# Patient Record
Sex: Male | Born: 1995 | Race: Black or African American | Hispanic: No | Marital: Single | Smoking: Light tobacco smoker
Health system: Southern US, Community
[De-identification: ages and names within clinical notes are randomized; demographics above are authoritative.]

## PROBLEM LIST (undated history)

## (undated) DIAGNOSIS — F32A Depression, unspecified: Secondary | ICD-10-CM

## (undated) DIAGNOSIS — B977 Papillomavirus as the cause of diseases classified elsewhere: Secondary | ICD-10-CM

## (undated) DIAGNOSIS — F800 Phonological disorder: Secondary | ICD-10-CM

## (undated) DIAGNOSIS — F341 Dysthymic disorder: Secondary | ICD-10-CM

## (undated) DIAGNOSIS — F329 Major depressive disorder, single episode, unspecified: Secondary | ICD-10-CM

---
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
## 2018-09-01 ENCOUNTER — Emergency Department (HOSPITAL_COMMUNITY)
Admission: EM | Admit: 2018-09-01 | Discharge: 2018-09-01 | Disposition: A | Discharge status: Home / Self Care | Payer: Medicaid Other | Attending: Emergency Medicine | Admitting: Emergency Medicine

## 2018-09-01 ENCOUNTER — Other Ambulatory Visit: Payer: Self-pay

## 2018-09-01 ENCOUNTER — Encounter (HOSPITAL_COMMUNITY): Payer: Self-pay | Admitting: Emergency Medicine

## 2018-09-01 DIAGNOSIS — Z79899 Other long term (current) drug therapy: Secondary | ICD-10-CM | POA: Insufficient documentation

## 2018-09-01 DIAGNOSIS — F17210 Nicotine dependence, cigarettes, uncomplicated: Secondary | ICD-10-CM | POA: Diagnosis not present

## 2018-09-01 DIAGNOSIS — K0889 Other specified disorders of teeth and supporting structures: Secondary | ICD-10-CM

## 2018-09-01 MED ORDER — IBUPROFEN 600 MG PO TABS: 600.0000 mg | ORAL_TABLET | Freq: Four times a day (QID) | ORAL | 0 refills | Status: DC | PRN

## 2018-09-01 NOTE — ED Triage Notes (Signed)
Right side of mouth pain, feels it is his wisdom teeth coming in. Not able to eat today d/t pain.

## 2018-09-01 NOTE — Discharge Instructions (Addendum)
Please read and follow all provided instructions.  Your diagnoses today include:  1. Pain, dental     The exam and treatment you received today has been provided on an emergency basis only. This is not a substitute for complete medical or dental care. This problem will not resolve on its own without the care of a dentist.  Tests performed today include: Vital signs. See below for your results today.   Medications prescribed:   Take any prescribed medications only as directed. Use ibuprofen or naproxen for pain. Use the viscous lidocaine for mouth pain. Swish with the lidocaine and spit it out. Do not swallow it.  Follow-up instructions: Please follow-up with your dentist for further evaluation of your symptoms. Use dental resources. Call on Monday to schedule an appointment (09/03/18)  Dental Assistance: See handout for dental referrals  Return instructions:  Please return to the Emergency Department if you experience worsening symptoms. Please return if you develop a fever, you develop more swelling in your face or neck, you have trouble breathing or swallowing food. Please return if you have any other emergent concerns.  Additional Information:  Your vital signs today were: BP 126/84 (BP Location: Right Arm)    Pulse 79    Temp 98.4 F (36.9 C) (Oral)    Resp 17    SpO2 100%  If your blood pressure (BP) was elevated above 135/85 this visit, please have this repeated by your doctor within one month. --------------

## 2018-09-01 NOTE — ED Provider Notes (Signed)
[facility] EMERGENCY DEPARTMENT Provider Note   CSN: [id number] Arrival date & time: 09/01/18  2706     History   Chief Complaint Chief Complaint  Patient presents with  . Dental Pain    HPI David Mays is a 22 y.o. male medical history as below who presents emergency department today for dental pain.  Patient reports that his right lower wisdom tooth has been coming in.  He reports is been painful.  He has not been taking anything for this.  He is requesting referral to a dentist as he does not have one. Denies fever, chills, voice change, inability to control secretions, nausea/vomiting, facial swelling, dysphagia, odynophagia, drainage or trauma  HPI  Past Medical History:  Diagnosis Date  . Depression   . Dysthymic disorder   . HPV (human papilloma virus) infection   . Phonological disorder     There are no active problems to display for this patient.   History reviewed. No pertinent surgical history.      Home Medications    Prior to Admission medications   Medication Sig Start Date End Date Taking? Authorizing Provider  escitalopram (LEXAPRO) 20 MG tablet Take 20 mg by mouth daily.    [provider]  ibuprofen (ADVIL,MOTRIN) 600 MG tablet Take 1 tablet (600 mg total) by mouth every 6 (six) hours as needed. 09/01/18   Rosaland Shiffman, Elmer Sow, PA-C    Family History No family history on file.  Social History Social History   Tobacco Use  . Smoking status: Light Tobacco Smoker    Types: Cigarettes  . Smokeless tobacco: Never Used  Substance Use Topics  . Alcohol use: Not Currently  . Drug use: Never     Allergies   Patient has no known allergies.   Review of Systems Review of Systems  All other systems reviewed and are negative.    Physical Exam Updated Vital Signs BP 126/84 (BP Location: Right Arm)   Pulse 79   Temp 98.4 F (36.9 C) (Oral)   Resp 17   SpO2 100%   Physical Exam  Constitutional: He appears  well-developed and well-nourished. No distress.  HENT:  Head: Normocephalic and atraumatic.  Right Ear: Hearing, tympanic membrane, external ear and ear canal normal. No foreign bodies. Tympanic membrane is not injected, not perforated, not erythematous, not retracted and not bulging.  Left Ear: Hearing, tympanic membrane, external ear and ear canal normal. No foreign bodies. Tympanic membrane is not injected, not perforated, not erythematous, not retracted and not bulging.  Nose: Nose normal. No mucosal edema, rhinorrhea, sinus tenderness, septal deviation or nasal septal hematoma.  No foreign bodies. Right sinus exhibits no maxillary sinus tenderness and no frontal sinus tenderness. Left sinus exhibits no maxillary sinus tenderness and no frontal sinus tenderness.  The patient has normal phonation and is in control of secretions. No stridor.  Midline uvula without edema. Soft palate rises symmetrically.  No tonsillar erythema or exudates. No PTA. Tongue protrusion is normal. No trismus. No creptius on neck palpation and patient has good dentition. Overall normal dentition. Tooth #32 emerging through gumline. No floor edema. No gingival erythema or fluctuance noted. Mucus membranes moist.  Eyes: Pupils are equal, round, and reactive to light. Conjunctivae, EOM and lids are normal. Right eye exhibits no discharge. Left eye exhibits no discharge. Right conjunctiva is not injected. Left conjunctiva is not injected.  Neck: Trachea normal, normal range of motion, full passive range of motion without pain and phonation normal.  Neck supple. No spinous process tenderness and no muscular tenderness present. No neck rigidity. No tracheal deviation and normal range of motion present.  No nuchal rigidity or meningismus. No crepitus on neck palpation. No submandibular or submental edema.  Cardiovascular: Normal rate and regular rhythm.  Pulmonary/Chest: Effort normal and breath sounds normal. No stridor. He has no  wheezes.  Abdominal: Soft.  Lymphadenopathy:       Head (right side): No submental, no submandibular, no tonsillar, no preauricular, no posterior auricular and no occipital adenopathy present.       Head (left side): No submental, no submandibular, no tonsillar, no preauricular, no posterior auricular and no occipital adenopathy present.    He has no cervical adenopathy.  Neurological: He is alert.  Skin: Skin is warm and dry. No rash noted.  Psychiatric: He has a normal mood and affect.  Nursing note and vitals reviewed.   ED Treatments / Results  Labs (all labs ordered are listed, but only abnormal results are displayed) Labs Reviewed - No data to display  EKG None  Radiology No results found.  Procedures Procedures (including critical care time)  Medications Ordered in ED Medications - No data to display   Initial Impression / Assessment and Plan / ED Course  I have reviewed the triage vital signs and the nursing notes.  Pertinent labs & imaging results that were available during my care of the patient were reviewed by me and considered in my medical decision making (see chart for details).     22 y.o. male with wisdom teeth coming through gumline. No gross abscess.  Exam unconcerning for Ludwig's angina or spread of infection. No indication for abx. Ibuprofen for pain. Dental resources given. Return precautions discussed.  Final Clinical Impressions(s) / ED Diagnoses   Final diagnoses:  Pain, dental    ED Discharge Orders         Ordered    ibuprofen (ADVIL,MOTRIN) 600 MG tablet  Every 6 hours PRN     09/01/18 2151           Princella Pellegrini 09/01/18 2235    Terrilee Files, MD 09/02/18 1208

## 2020-06-05 ENCOUNTER — Ambulatory Visit: Payer: Medicaid Other | Attending: Internal Medicine

## 2020-06-05 DIAGNOSIS — Z23 Encounter for immunization: Secondary | ICD-10-CM

## 2020-06-05 NOTE — Progress Notes (Signed)
   Covid-19 Vaccination Clinic  Name:  David Mays    MRN: [id number] DOB: 1995-12-26  06/05/2020  [name] was observed post Covid-19 immunization for 15 minutes without incident. He was provided with Vaccine Information Sheet and instruction to access the V-Safe system.   [name] was instructed to call 911 with any severe reactions post vaccine: Marland Kitchen Difficulty breathing  . Swelling of face and throat  . A fast heartbeat  . A bad rash all over body  . Dizziness and weakness   Immunizations Administered    Name Date Dose VIS Date Route   Moderna COVID-19 Vaccine 06/05/2020  1:08 PM 0.5 mL 10/2019 Intramuscular   Manufacturer: Moderna   Lot: [id number]   NDC: 20601-561-53

## 2020-06-09 ENCOUNTER — Emergency Department (HOSPITAL_COMMUNITY): Payer: Medicaid Other

## 2020-06-09 ENCOUNTER — Other Ambulatory Visit: Payer: Self-pay

## 2020-06-09 ENCOUNTER — Emergency Department (HOSPITAL_COMMUNITY)
Admission: EM | Admit: 2020-06-09 | Discharge: 2020-06-09 | Disposition: A | Discharge status: Home / Self Care | Payer: Medicaid Other | Attending: Emergency Medicine | Admitting: Emergency Medicine

## 2020-06-09 ENCOUNTER — Encounter (HOSPITAL_COMMUNITY): Payer: Self-pay | Admitting: Emergency Medicine

## 2020-06-09 DIAGNOSIS — R0789 Other chest pain: Secondary | ICD-10-CM | POA: Diagnosis not present

## 2020-06-09 DIAGNOSIS — F17210 Nicotine dependence, cigarettes, uncomplicated: Secondary | ICD-10-CM | POA: Diagnosis not present

## 2020-06-09 DIAGNOSIS — M25511 Pain in right shoulder: Secondary | ICD-10-CM | POA: Diagnosis present
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
## 2020-06-09 IMAGING — DX DG SHOULDER 2+V*R*
3 series · 3 of 3 positions shown · non-contrast
Comparison: None.

CLINICAL DATA: Right shoulder pain

EXAM:
RIGHT SHOULDER - 2+ VIEW

[shoulder grashey]
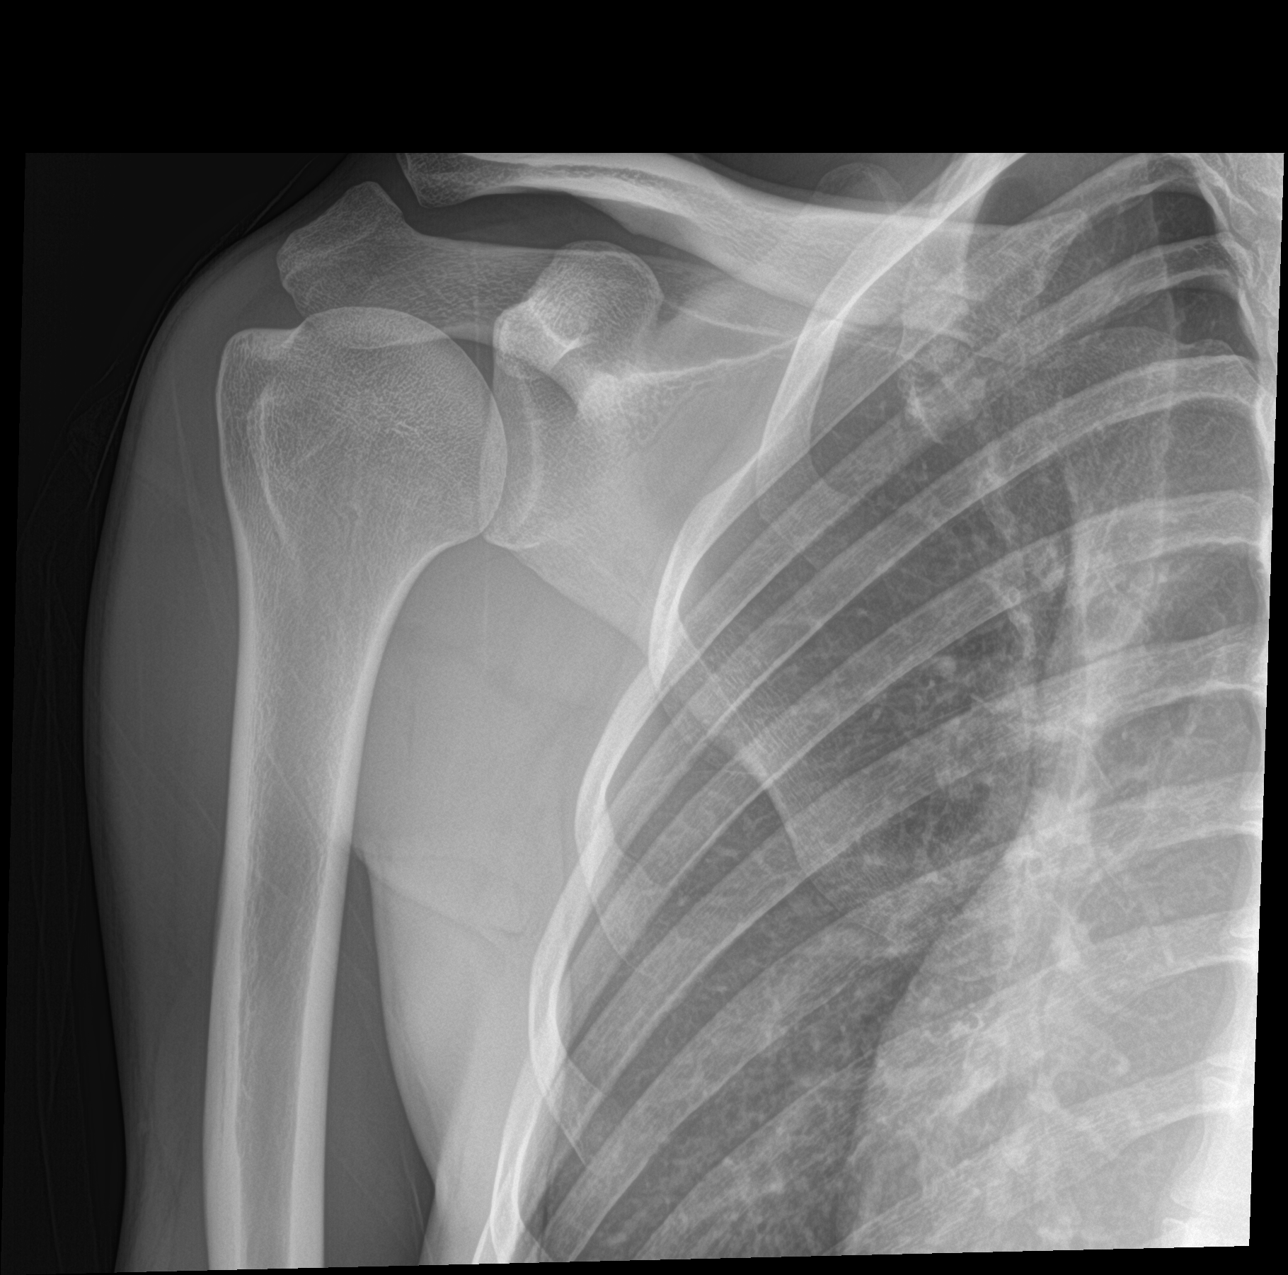

[shoulder y view]
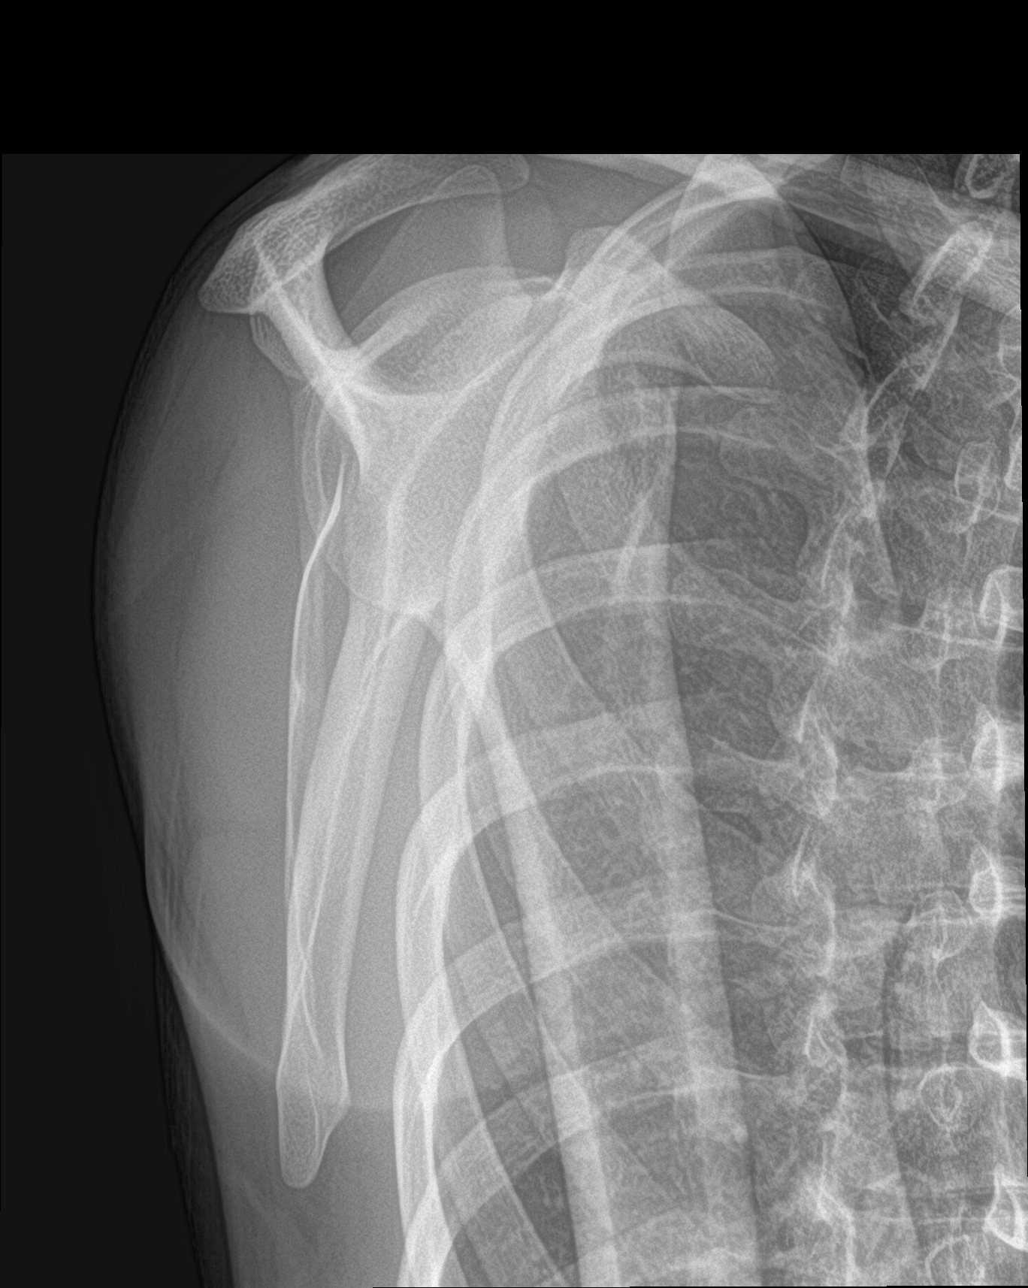

[shoulder ap neutral]
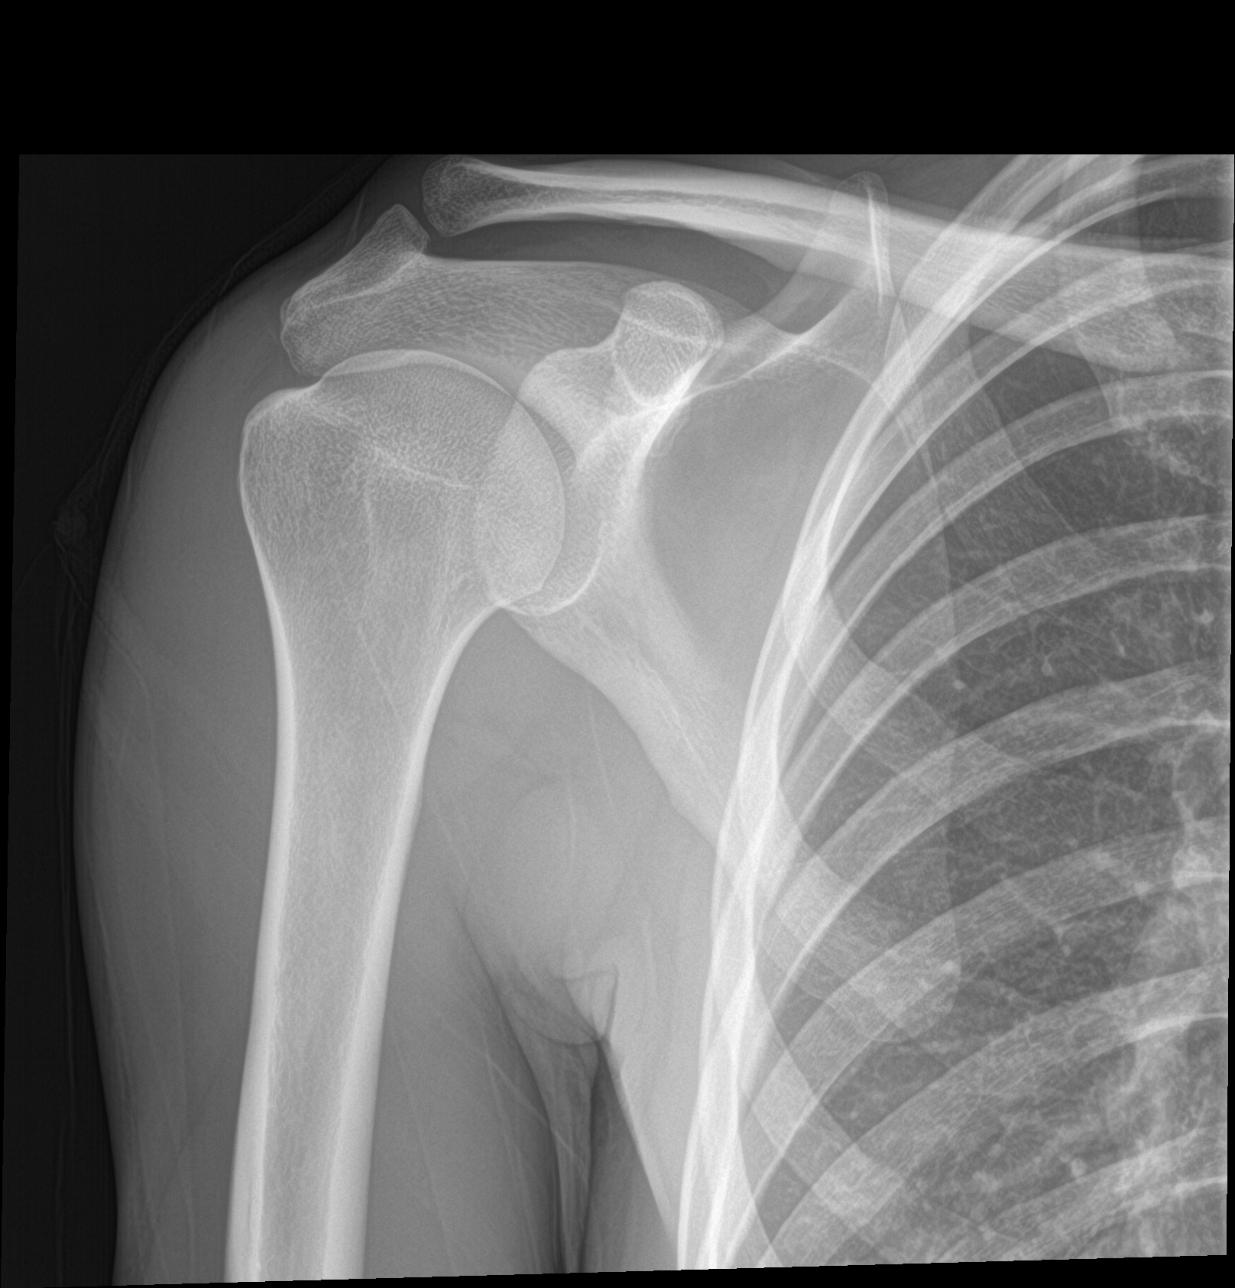

[3 of 3 positions shown; findings below may reference images not displayed]

FINDINGS: There is no evidence of fracture or dislocation. There is no
evidence of arthropathy or other focal bone abnormality. Soft
tissues are unremarkable.
IMPRESSION: Negative.
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
## 2020-06-09 MED ORDER — LIDOCAINE 4 % EX CREA: 1.0000 "application " | TOPICAL_CREAM | Freq: Three times a day (TID) | CUTANEOUS | 0 refills | Status: DC | PRN

## 2020-06-09 MED ORDER — ACETAMINOPHEN 500 MG PO TABS: 500.0000 mg | ORAL_TABLET | Freq: Four times a day (QID) | ORAL | 0 refills | Status: DC | PRN

## 2020-06-09 MED ORDER — IBUPROFEN 400 MG PO TABS
600.0000 mg | ORAL_TABLET | Freq: Once | ORAL | Status: AC
  Administered 2020-06-09: 600 mg via ORAL
  Filled 2020-06-09: qty 1

## 2020-06-09 MED ORDER — LIDOCAINE 5 % EX PTCH
2.0000 | MEDICATED_PATCH | CUTANEOUS | Status: DC
  Administered 2020-06-09: 2 via TRANSDERMAL
  Filled 2020-06-09: qty 2
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
## 2020-06-09 NOTE — Discharge Instructions (Signed)
Your x-rays today were reassuring. You can take extra strength Tylenol every 6 hours as needed for pain. You can also apply lidocaine cream to areas of pain. Apply ice or heat, whichever feels best 20 minutes a time a few times daily to areas of pain. Do some gentle stretching to avoid muscle stiffness. Follow-up with David Mays at the [facility] for medications and primary care follow-up. Return to the emergency department if any concerning signs or symptoms develop such as persistent vomiting, severe headaches, or fevers.

## 2020-07-07 ENCOUNTER — Ambulatory Visit: Payer: Medicaid Other

## 2021-02-02 ENCOUNTER — Inpatient Hospital Stay (HOSPITAL_COMMUNITY)
Admission: EM | Admit: 2021-02-02 | Discharge: 2021-02-09 | DRG: 472 | Disposition: A | Discharge status: Home / Self Care | Payer: Medicaid Other | Attending: General Surgery | Admitting: General Surgery

## 2021-02-02 ENCOUNTER — Emergency Department (HOSPITAL_COMMUNITY): Payer: Medicaid Other

## 2021-02-02 ENCOUNTER — Inpatient Hospital Stay (HOSPITAL_COMMUNITY): Payer: Medicaid Other

## 2021-02-02 DIAGNOSIS — T1490XA Injury, unspecified, initial encounter: Secondary | ICD-10-CM

## 2021-02-02 DIAGNOSIS — Z419 Encounter for procedure for purposes other than remedying health state, unspecified: Secondary | ICD-10-CM

## 2021-02-02 DIAGNOSIS — S52512A Displaced fracture of left radial styloid process, initial encounter for closed fracture: Secondary | ICD-10-CM | POA: Diagnosis present

## 2021-02-02 DIAGNOSIS — S0003XA Contusion of scalp, initial encounter: Secondary | ICD-10-CM | POA: Diagnosis present

## 2021-02-02 DIAGNOSIS — G5602 Carpal tunnel syndrome, left upper limb: Secondary | ICD-10-CM | POA: Diagnosis present

## 2021-02-02 DIAGNOSIS — S0081XA Abrasion of other part of head, initial encounter: Secondary | ICD-10-CM | POA: Diagnosis present

## 2021-02-02 DIAGNOSIS — S12600A Unspecified displaced fracture of seventh cervical vertebra, initial encounter for closed fracture: Secondary | ICD-10-CM | POA: Diagnosis present

## 2021-02-02 DIAGNOSIS — S12500A Unspecified displaced fracture of sixth cervical vertebra, initial encounter for closed fracture: Secondary | ICD-10-CM | POA: Diagnosis present

## 2021-02-02 DIAGNOSIS — H5462 Unqualified visual loss, left eye, normal vision right eye: Secondary | ICD-10-CM | POA: Diagnosis present

## 2021-02-02 DIAGNOSIS — Z23 Encounter for immunization: Secondary | ICD-10-CM

## 2021-02-02 DIAGNOSIS — D62 Acute posthemorrhagic anemia: Secondary | ICD-10-CM | POA: Diagnosis not present

## 2021-02-02 DIAGNOSIS — S12400A Unspecified displaced fracture of fifth cervical vertebra, initial encounter for closed fracture: Principal | ICD-10-CM | POA: Diagnosis present

## 2021-02-02 DIAGNOSIS — S30810A Abrasion of lower back and pelvis, initial encounter: Secondary | ICD-10-CM | POA: Diagnosis present

## 2021-02-02 DIAGNOSIS — M4312 Spondylolisthesis, cervical region: Secondary | ICD-10-CM | POA: Diagnosis present

## 2021-02-02 DIAGNOSIS — S92252A Displaced fracture of navicular [scaphoid] of left foot, initial encounter for closed fracture: Secondary | ICD-10-CM | POA: Diagnosis present

## 2021-02-02 DIAGNOSIS — Z5900 Homelessness unspecified: Secondary | ICD-10-CM

## 2021-02-02 DIAGNOSIS — Z20822 Contact with and (suspected) exposure to covid-19: Secondary | ICD-10-CM | POA: Diagnosis present

## 2021-02-02 DIAGNOSIS — S6412XA Injury of median nerve at wrist and hand level of left arm, initial encounter: Secondary | ICD-10-CM | POA: Diagnosis present

## 2021-02-02 LAB — URINALYSIS, ROUTINE W REFLEX MICROSCOPIC
Bacteria, UA: NONE SEEN
Bilirubin Urine: NEGATIVE
Glucose, UA: NEGATIVE mg/dL
Ketones, ur: NEGATIVE mg/dL
Leukocytes,Ua: NEGATIVE
Nitrite: NEGATIVE
Protein, ur: NEGATIVE mg/dL
Specific Gravity, Urine: 1.019 (ref 1.005–1.030)
pH: 6 (ref 5.0–8.0)

## 2021-02-02 LAB — COMPREHENSIVE METABOLIC PANEL
ALT: 22 U/L (ref 0–44)
AST: 35 U/L (ref 15–41)
Albumin: 4.5 g/dL (ref 3.5–5.0)
Alkaline Phosphatase: 52 U/L (ref 38–126)
Anion gap: 10 (ref 5–15)
BUN: 9 mg/dL (ref 6–20)
CO2: 23 mmol/L (ref 22–32)
Calcium: 9.5 mg/dL (ref 8.9–10.3)
Chloride: 100 mmol/L (ref 98–111)
Creatinine, Ser: 1.06 mg/dL (ref 0.61–1.24)
GFR, Estimated: >60 mL/min (ref >60)
Glucose, Bld: 129 mg/dL — ABNORMAL HIGH (ref 70–99)
Potassium: 3.1 mmol/L — ABNORMAL LOW (ref 3.5–5.1)
Sodium: 133 mmol/L — ABNORMAL LOW (ref 135–145)
Total Bilirubin: 0.8 mg/dL (ref 0.3–1.2)
Total Protein: 7.4 g/dL (ref 6.5–8.1)

## 2021-02-02 LAB — CBC
HCT: 43.1 % (ref 39.0–52.0)
Hemoglobin: 14.1 g/dL (ref 13.0–17.0)
MCH: 29.9 pg (ref 26.0–34.0)
MCHC: 32.7 g/dL (ref 30.0–36.0)
MCV: 91.5 fL (ref 80.0–100.0)
Platelets: 274 10*3/uL (ref 150–400)
RBC: 4.71 MIL/uL (ref 4.22–5.81)
RDW: 13.3 % (ref 11.5–15.5)
WBC: 12.3 10*3/uL — ABNORMAL HIGH (ref 4.0–10.5)
nRBC: 0 % (ref 0.0–0.2)

## 2021-02-02 LAB — I-STAT CHEM 8, ED
BUN: 10 mg/dL (ref 6–20)
Calcium, Ion: 1.05 mmol/L — ABNORMAL LOW (ref 1.15–1.40)
Chloride: 102 mmol/L (ref 98–111)
Creatinine, Ser: 1 mg/dL (ref 0.61–1.24)
Glucose, Bld: 129 mg/dL — ABNORMAL HIGH (ref 70–99)
HCT: 43 % (ref 39.0–52.0)
Hemoglobin: 14.6 g/dL (ref 13.0–17.0)
Potassium: 3.1 mmol/L — ABNORMAL LOW (ref 3.5–5.1)
Sodium: 138 mmol/L (ref 135–145)
TCO2: 24 mmol/L (ref 22–32)

## 2021-02-02 LAB — PROTIME-INR
INR: 1.2 (ref 0.8–1.2)
Prothrombin Time: 14.6 seconds (ref 11.4–15.2)

## 2021-02-02 LAB — LACTIC ACID, PLASMA: Lactic Acid, Venous: 2.2 mmol/L (ref 0.5–1.9)

## 2021-02-02 LAB — SAMPLE TO BLOOD BANK

## 2021-02-02 LAB — ETHANOL: Alcohol, Ethyl (B): <10 mg/dL (ref <10)
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
## 2021-02-02 MED ORDER — PROPOFOL 10 MG/ML IV BOLUS
INTRAVENOUS | Status: AC
  Administered 2021-02-02: 36.3 mg via INTRAVENOUS
  Filled 2021-02-02: qty 20

## 2021-02-02 MED ORDER — PROPOFOL 10 MG/ML IV BOLUS: 0.5000 mg/kg | Freq: Once | INTRAVENOUS | Status: AC

## 2021-02-02 MED ORDER — HYDROMORPHONE HCL 1 MG/ML IJ SOLN
1.0000 mg | INTRAMUSCULAR | Status: DC | PRN
Start: 2021-02-02 — End: 2021-02-04
  Administered 2021-02-03 – 2021-02-04 (×3): 1 mg via INTRAVENOUS
  Filled 2021-02-02 (×3): qty 1

## 2021-02-02 MED ORDER — SODIUM CHLORIDE 0.9 % IV SOLN
INTRAVENOUS | Status: AC | PRN
  Administered 2021-02-02: 1000 mL via INTRAVENOUS

## 2021-02-02 MED ORDER — MIDAZOLAM HCL 2 MG/2ML IJ SOLN
2.0000 mg | Freq: Once | INTRAMUSCULAR | Status: DC | PRN
  Filled 2021-02-02 (×2): qty 2

## 2021-02-02 MED ORDER — SODIUM CHLORIDE 0.9 % IV BOLUS
1000.0000 mL | Freq: Once | INTRAVENOUS | Status: AC
  Administered 2021-02-02: 1000 mL via INTRAVENOUS

## 2021-02-02 MED ORDER — DEXTROSE-NACL 5-0.9 % IV SOLN: INTRAVENOUS | Status: DC

## 2021-02-02 MED ORDER — IOHEXOL 350 MG/ML SOLN
75.0000 mL | Freq: Once | INTRAVENOUS | Status: AC | PRN
  Administered 2021-02-02: 75 mL via INTRAVENOUS

## 2021-02-02 MED ORDER — TETANUS-DIPHTH-ACELL PERTUSSIS 5-2.5-18.5 LF-MCG/0.5 IM SUSY
0.5000 mL | PREFILLED_SYRINGE | Freq: Once | INTRAMUSCULAR | Status: AC
  Administered 2021-02-02: 0.5 mL via INTRAMUSCULAR
  Filled 2021-02-02: qty 0.5

## 2021-02-02 MED ORDER — HYDROMORPHONE HCL 1 MG/ML IJ SOLN
1.0000 mg | Freq: Once | INTRAMUSCULAR | Status: AC
Start: 2021-02-02 — End: 2021-02-02
  Filled 2021-02-02: qty 1

## 2021-02-02 MED ORDER — ONDANSETRON 4 MG PO TBDP: 4.0000 mg | ORAL_TABLET | Freq: Four times a day (QID) | ORAL | Status: DC | PRN

## 2021-02-02 MED ORDER — ONDANSETRON HCL 4 MG/2ML IJ SOLN
4.0000 mg | Freq: Four times a day (QID) | INTRAMUSCULAR | Status: DC | PRN
  Administered 2021-02-03: 4 mg via INTRAVENOUS
  Filled 2021-02-02: qty 2

## 2021-02-02 MED ORDER — PANTOPRAZOLE SODIUM 40 MG PO TBEC
40.0000 mg | DELAYED_RELEASE_TABLET | Freq: Every day | ORAL | Status: DC
  Administered 2021-02-04 – 2021-02-09 (×6): 40 mg via ORAL
  Filled 2021-02-02 (×6): qty 1

## 2021-02-02 MED ORDER — IOHEXOL 300 MG/ML  SOLN
100.0000 mL | Freq: Once | INTRAMUSCULAR | Status: AC | PRN
  Administered 2021-02-02: 100 mL via INTRAVENOUS

## 2021-02-02 MED ORDER — HYDROMORPHONE HCL 1 MG/ML IJ SOLN
INTRAMUSCULAR | Status: AC
  Administered 2021-02-02: 1 mg via INTRAVENOUS
  Filled 2021-02-02: qty 1

## 2021-02-02 MED ORDER — HYDROMORPHONE HCL 1 MG/ML IJ SOLN
INTRAMUSCULAR | Status: AC | PRN
  Administered 2021-02-02: 1 mg via INTRAVENOUS

## 2021-02-02 MED ORDER — PANTOPRAZOLE SODIUM 40 MG IV SOLR
40.0000 mg | Freq: Every day | INTRAVENOUS | Status: DC
  Filled 2021-02-02: qty 40
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
## 2021-02-02 NOTE — Progress Notes (Deleted)
Pt was given meds before coming over to MRI, during the first scan pt was wiggling his way out of the machine. I brought Pt out and he said "it's freaking me out." Repeatedly. I explained his RN had already given him meds, and he stated it was not enough. Pt refused exam, and was sent back to the ED.

## 2021-02-02 NOTE — ED Notes (Signed)
Portable xray complete, RMN and pt to CT

## 2021-02-02 NOTE — ED Notes (Signed)
RN and pt back from CT.  Reports pain is "much better."

## 2021-02-02 NOTE — ED Notes (Signed)
PT and RN to MRI 

## 2021-02-02 NOTE — ED Notes (Signed)
RN informed provider of complaints of pain and swelling to left wrist.  Provider bedside, ordered additional imaging.    Aspen Collar placed by provider, TRN, NT and this RN.  Afterward, pt became tearful. "I was just trying to go to my job..... I'm homeless... I don't even know what to do."
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
## 2021-02-02 NOTE — ED Notes (Signed)
Paged Neuro Provider for additional MRI tests,MRI tec spoke to Dr. Bebe Shaggy who did order them.

## 2021-02-02 NOTE — Progress Notes (Signed)
Orthopedic Tech Progress Note Patient Details:  David Mays May 06, 1996 [id number]  Ortho Devices Type of Ortho Device: Sugartong splint Ortho Device/Splint Location: Left Upper Extremity Ortho Device/Splint Interventions: Ordered,Application   Post Interventions Patient Tolerated: Well Instructions Provided: Care of device,Poper ambulation with device   David Mays 02/02/2021, 11:17 PM
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
## 2021-02-02 NOTE — ED Notes (Signed)
Provider bedside, requests we wait to go to MRI until after the [facility] provider see's pt.  RN contacted MRI, will postpone pt.  Provide ordered Versed for MRI.
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
## 2021-02-02 NOTE — ED Notes (Signed)
Report given to [facility] RN. Pt will be transferred to 4N after MRI.

## 2021-02-03 ENCOUNTER — Inpatient Hospital Stay (HOSPITAL_COMMUNITY): Payer: Medicaid Other

## 2021-02-03 ENCOUNTER — Encounter (HOSPITAL_COMMUNITY): Admission: EM | Disposition: A | Discharge status: Home / Self Care | Payer: Self-pay | Source: Home / Self Care

## 2021-02-03 ENCOUNTER — Inpatient Hospital Stay (HOSPITAL_COMMUNITY): Payer: Medicaid Other | Admitting: Anesthesiology

## 2021-02-03 HISTORY — PX: ANTERIOR CERVICAL DECOMP/DISCECTOMY FUSION: SHX1161

## 2021-02-03 HISTORY — PX: ORIF WRIST FRACTURE: SHX2133

## 2021-02-03 LAB — RESP PANEL BY RT-PCR (FLU A&B, COVID) ARPGX2
Influenza A by PCR: NEGATIVE
Influenza B by PCR: NEGATIVE
SARS Coronavirus 2 by RT PCR: NEGATIVE

## 2021-02-03 LAB — MRSA PCR SCREENING: MRSA by PCR: NEGATIVE

## 2021-02-03 IMAGING — RF DG CERVICAL SPINE 2 OR 3 VIEWS
1 series · 2 of 2 positions shown · non-contrast
Comparison: Cervical spine MRI [DATE].

CLINICAL DATA: Provided history: ACDF [DATE]-T1. Cervical 5-6,
cervical 6-7, cervical 7-thoracic 1 anterior cervical
decompression/discectomy fusion. Provided fluoroscopy time 16
seconds (5.23 mGy).

EXAM:
CERVICAL SPINE - 2-3 VIEW; DG C-ARM 1-60 MIN

[Series 1: run · 2 of 2 slices shown]
[im 1/2]
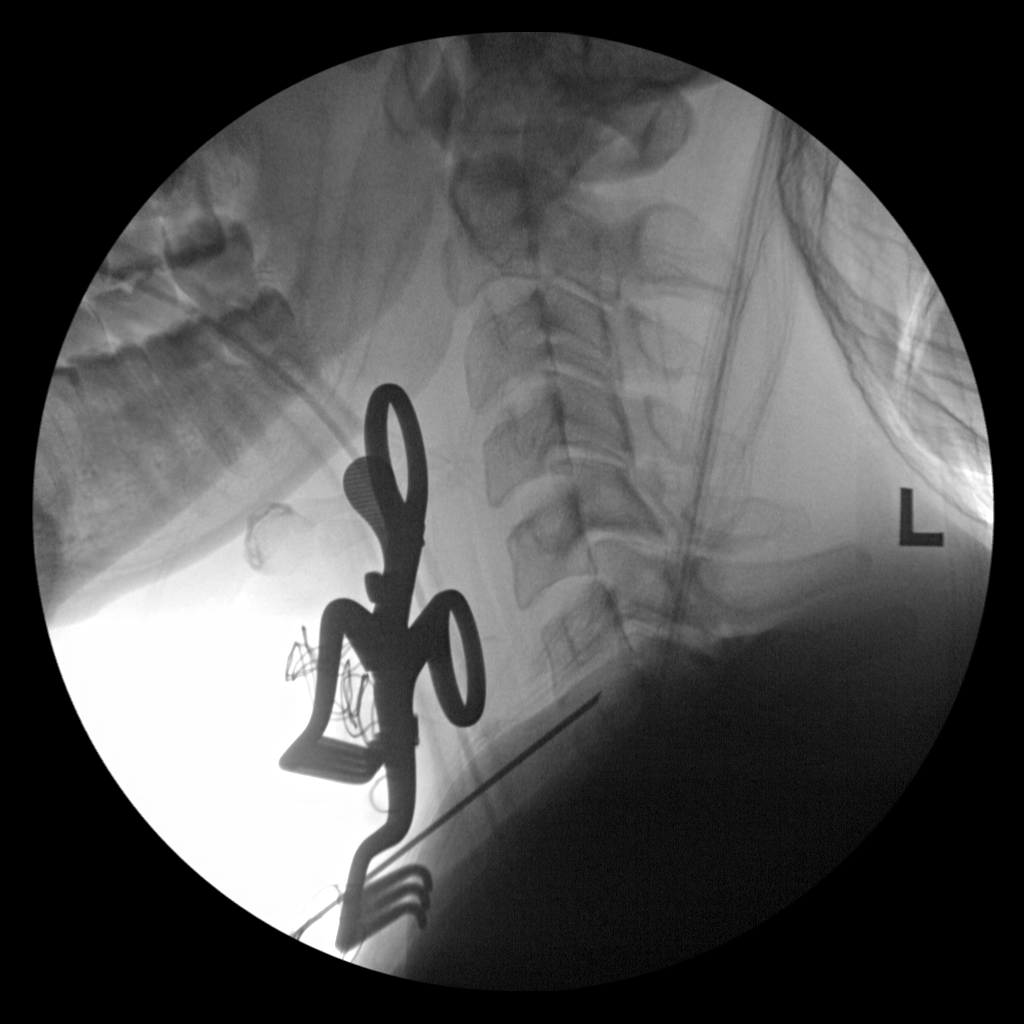
[im 2/2]
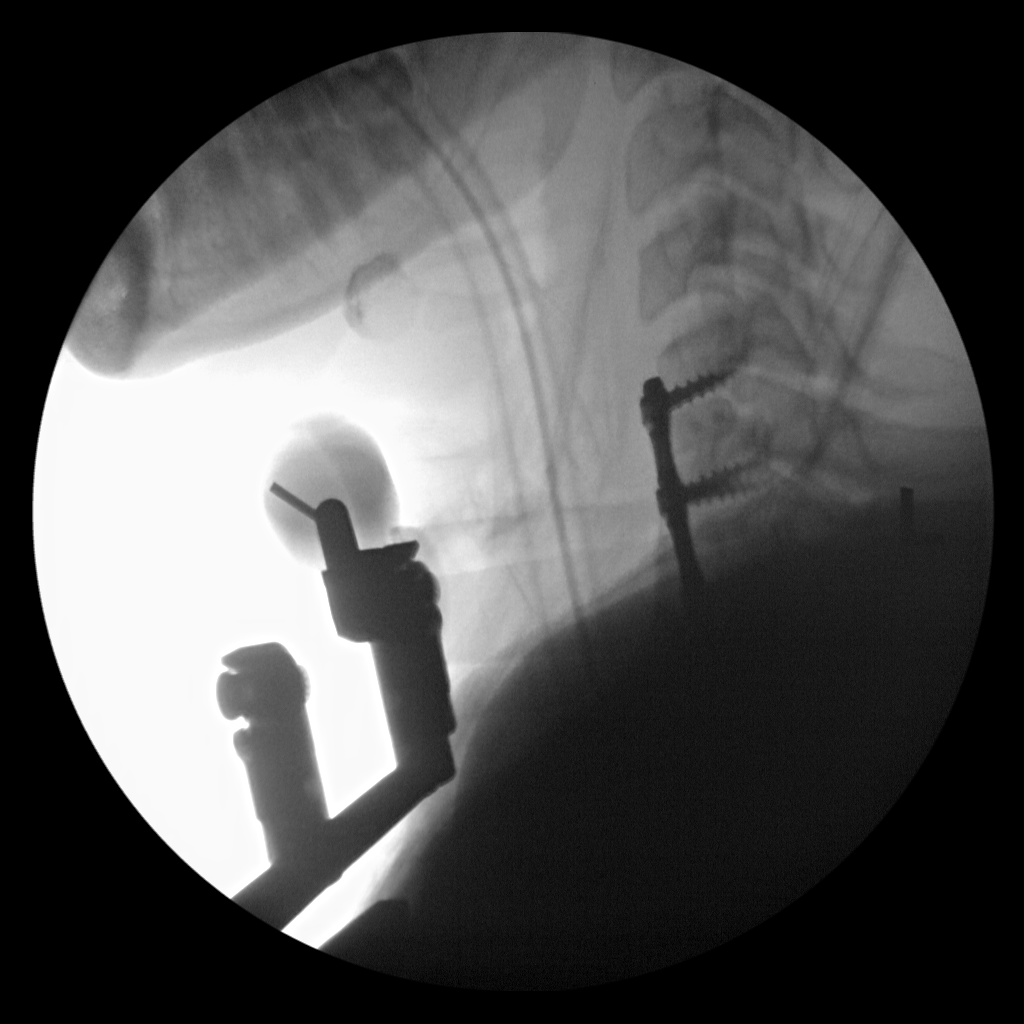

[2 of 2 positions shown; findings below may reference images not displayed]

FINDINGS: Two lateral view intraoperative fluoroscopic images of the cervical
spine are submitted. On the initial provided image, retractors and a
surgical sponge/packing material project ventral to the cervical
spine. Also on this image, a metallic instrument is present with tip
projecting in the C6-C7 interspace. On the later provided image,
retractors again project ventral to the cervical spine. Also on this
image, there is ACDF hardware (ventral plate and screws) extending
from the C5 level caudally at least to the C7 level (the more caudal
levels are obscured by superimposition of the patient's shoulders).
Partially visualized ET tube on both provided images.
IMPRESSION: Two lateral view intraoperative fluoroscopic images from reported
C5-T1 ACDF, as described.

## 2021-02-03 IMAGING — MR MR CERVICAL SPINE W/O CM
5 of 6 series · 33 of 48 positions shown · non-contrast
Comparison: None.

CLINICAL DATA: Cervical spine fracture

EXAM:
MRI CERVICAL SPINE WITHOUT CONTRAST
TECHNIQUE: Multiplanar, multisequence MR imaging of the cervical spine was
performed. No intravenous contrast was administered.

[Series 5: T2 · sagittal · 3.0mm · 0.69mm/px · 4 of 17 slices shown (1 of 2)]
[im 1/17]
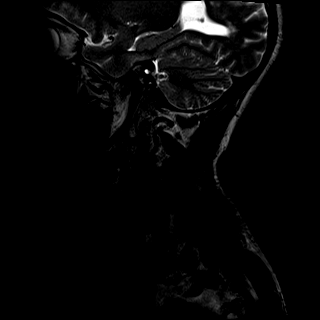
[im 6/17]
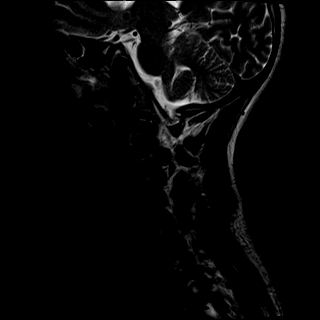
[im 11/17]
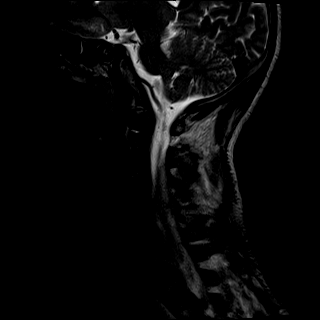
[im 17/17]
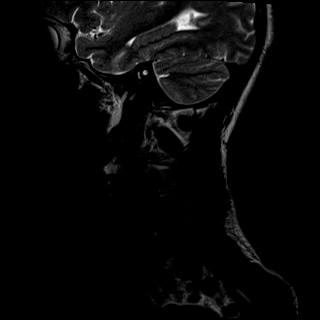

[Series 6: T1 · sagittal · 3.0mm · 0.69mm/px · 4 of 17 slices shown (1 of 2)]
[im 1/17]
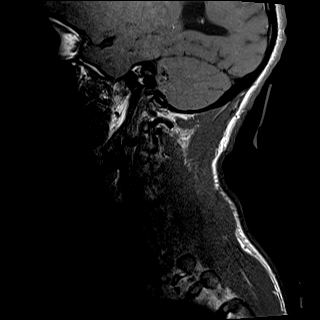
[im 6/17]
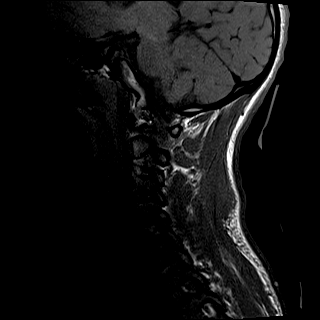
[im 11/17]
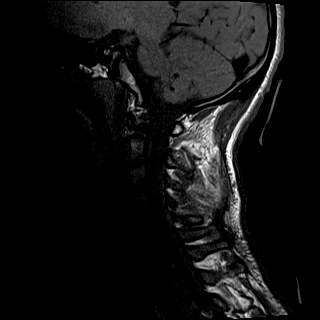
[im 17/17]
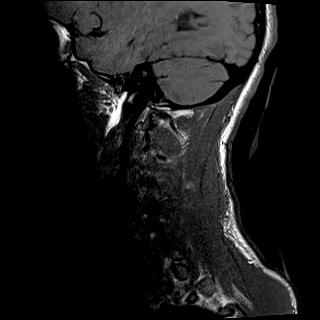

[Series 7: STIR · sagittal · 3.0mm · 0.86mm/px · 5 of 17 slices shown]
[im 1/17]
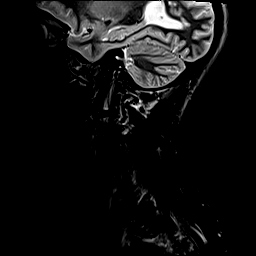
[im 5/17]
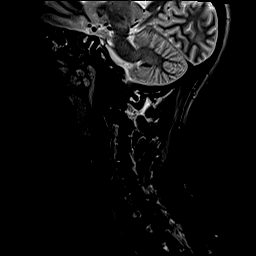
[im 9/17]
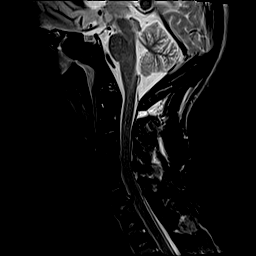
[im 13/17]
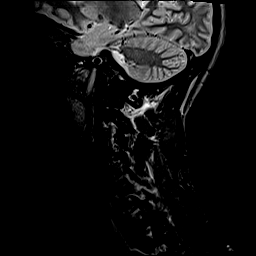
[im 17/17]
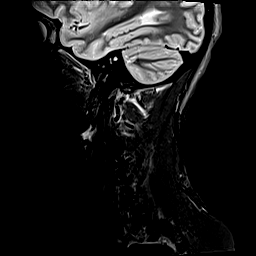

[Series 10: T1 · axial · 3.0mm · 0.39mm/px · z∈[-143,-18]mm · 11 of 40 slices shown (2 of 2)]
[im 1/40]
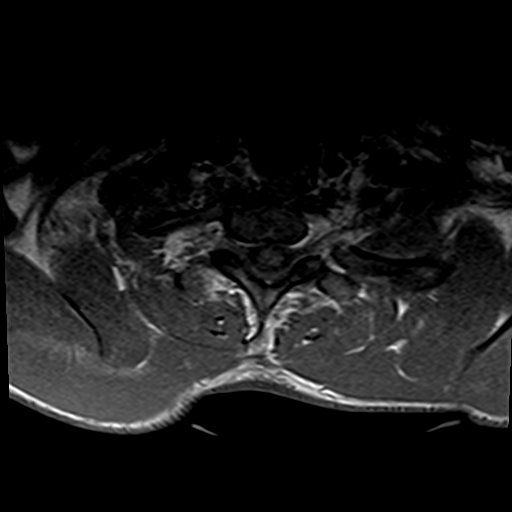
[im 4/40]
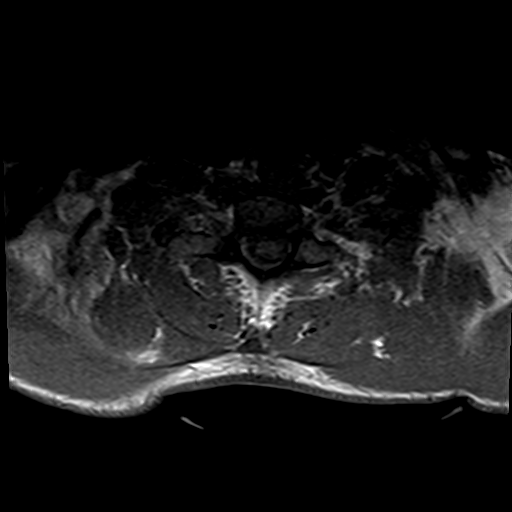
[im 8/40]
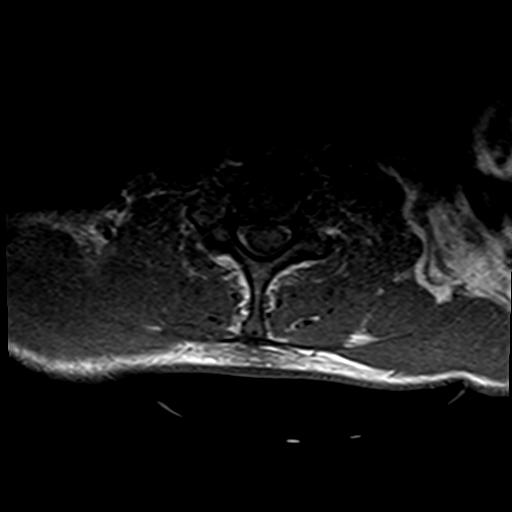
[im 12/40]
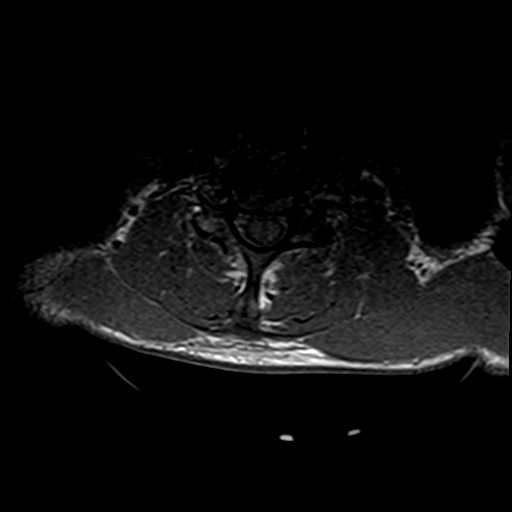
[im 16/40]
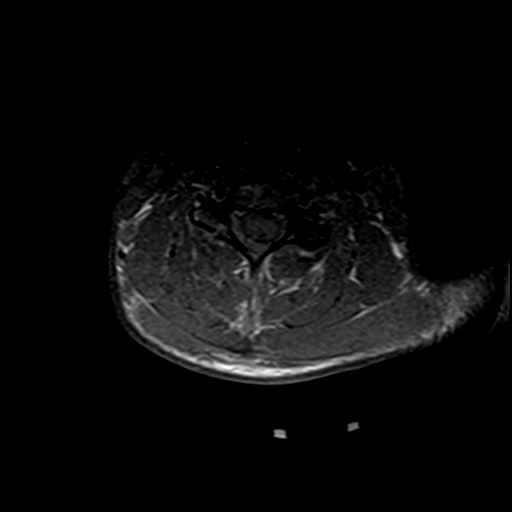
[im 20/40]
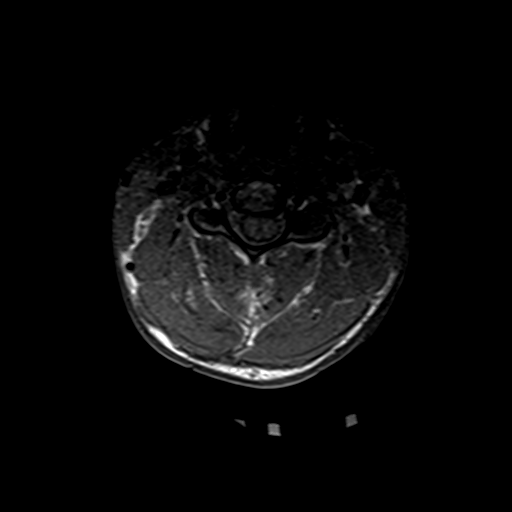
[im 24/40]
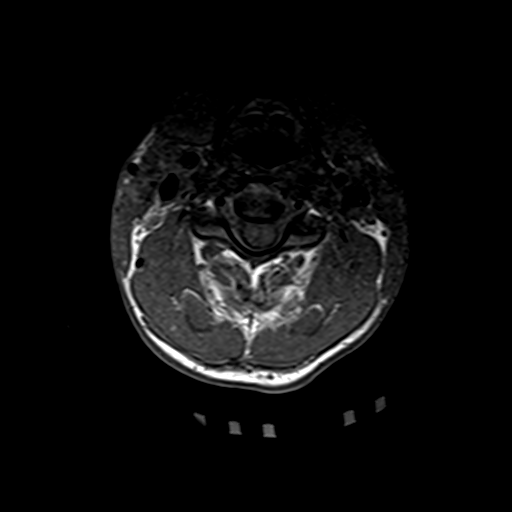
[im 28/40]
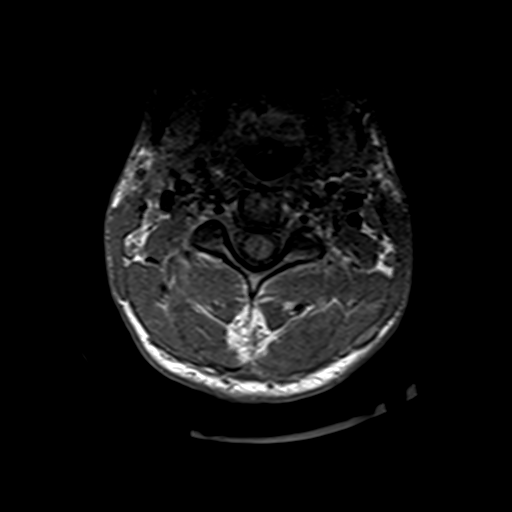
[im 32/40]
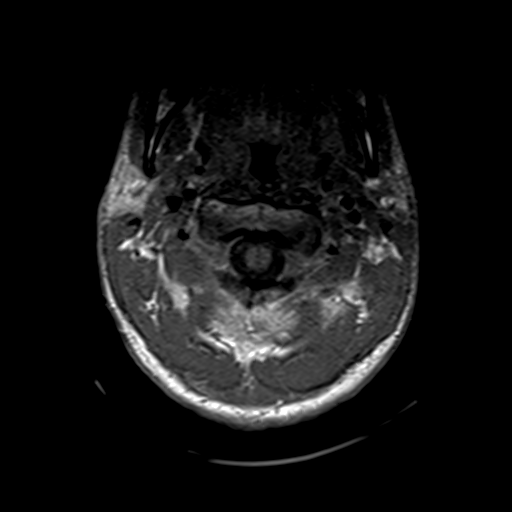
[im 36/40]
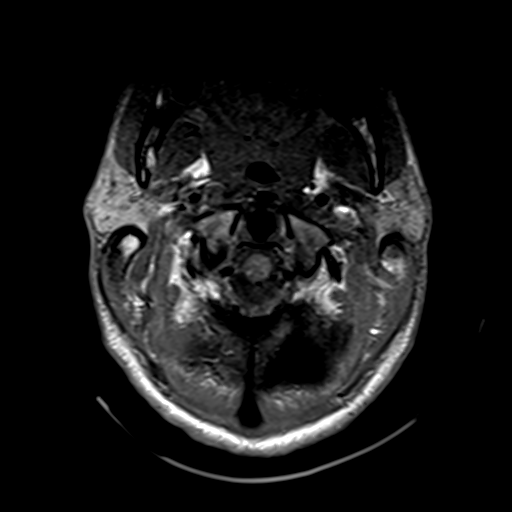
[im 40/40]
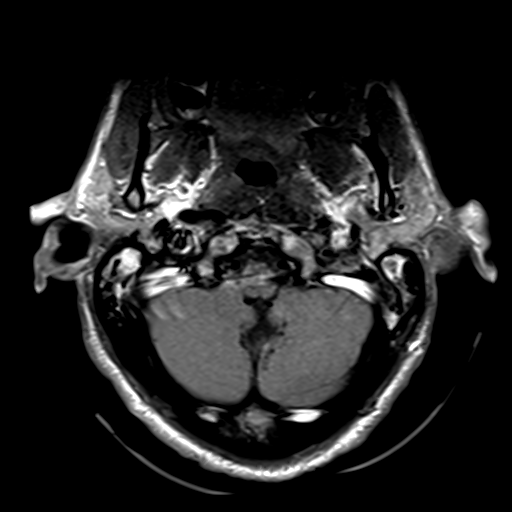

[Series 11: T2 · axial · 3.0mm · 0.66mm/px · z∈[-148,-13]mm · 9 of 43 slices shown (2 of 2)]
[im 1/43]
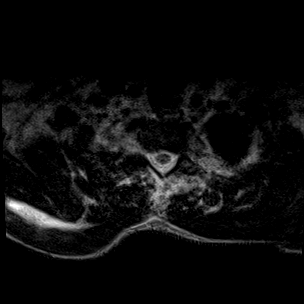
[im 8/43]
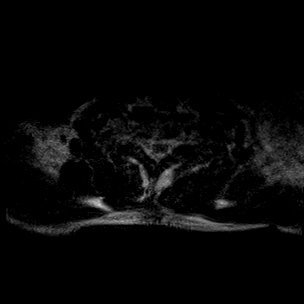
[im 12/43]
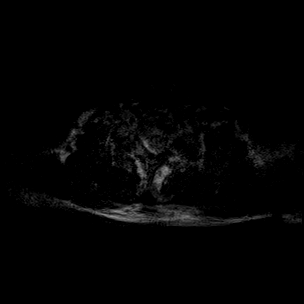
[im 20/43]
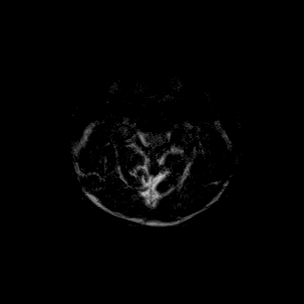
[im 23/43]
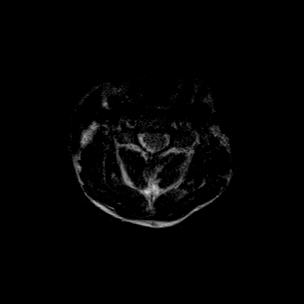
[im 31/43]
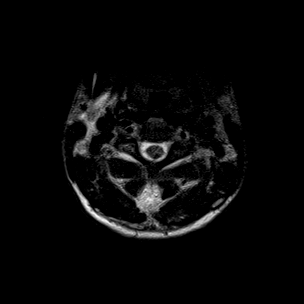
[im 35/43]
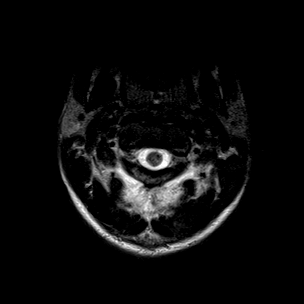
[im 39/43]
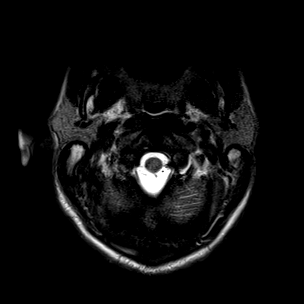
[im 43/43]
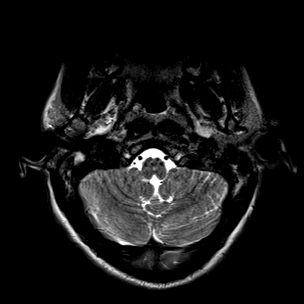

[33 of 48 positions shown; findings below may reference images not displayed]

FINDINGS: Alignment: Normal

Vertebrae: Predominantly right-sided cervical spine fractures are
better characterized on the earlier cervical spine CT.

Ligaments: There is a full-thickness tear of ligamentum flavum at
the C5-6 level. There is inter spinous edema at the C3-5 levels. No
anterior or posterior longitudinal ligament tear.

Cord: Normal signal and morphology.

Posterior Fossa, vertebral arteries, paraspinal tissues: Small
prevertebral effusion.

Disc levels:

There is no spinal canal or neural foraminal stenosis.
IMPRESSION: 1. Full-thickness tear of ligamentum flavum at the C5-6 level.
2. Interspinous ligament edema at the C3-5 levels.
3. Predominantly right-sided C5-7 fractures are better characterized
on the earlier cervical spine CT.

## 2021-02-03 IMAGING — RF DG C-ARM 1-60 MIN
1 series · 2 of 2 positions shown · non-contrast
Comparison: Cervical spine MRI [DATE].

CLINICAL DATA: Provided history: ACDF [DATE]-T1. Cervical 5-6,
cervical 6-7, cervical 7-thoracic 1 anterior cervical
decompression/discectomy fusion. Provided fluoroscopy time 16
seconds (5.23 mGy).

EXAM:
CERVICAL SPINE - 2-3 VIEW; DG C-ARM 1-60 MIN

[Series 1: run · 2 of 2 slices shown]
[im 1/2]
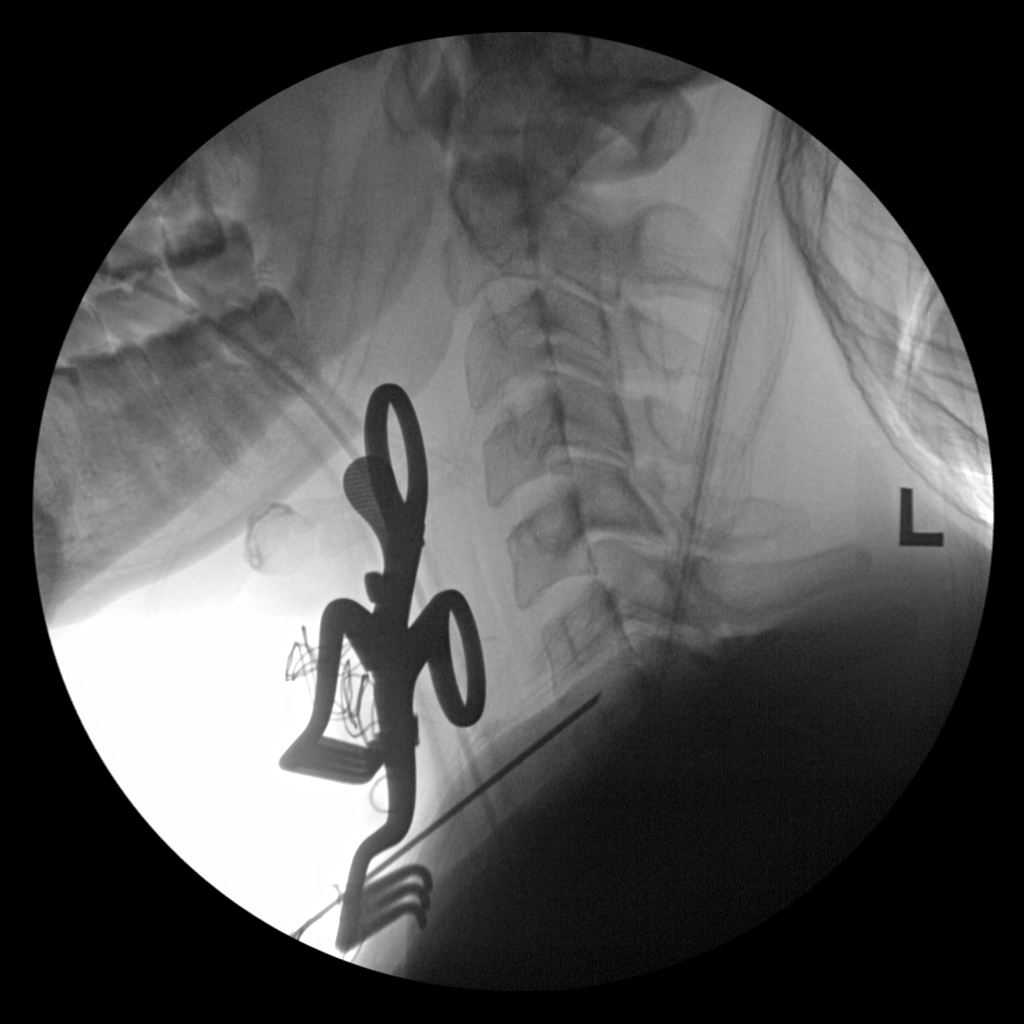
[im 2/2]
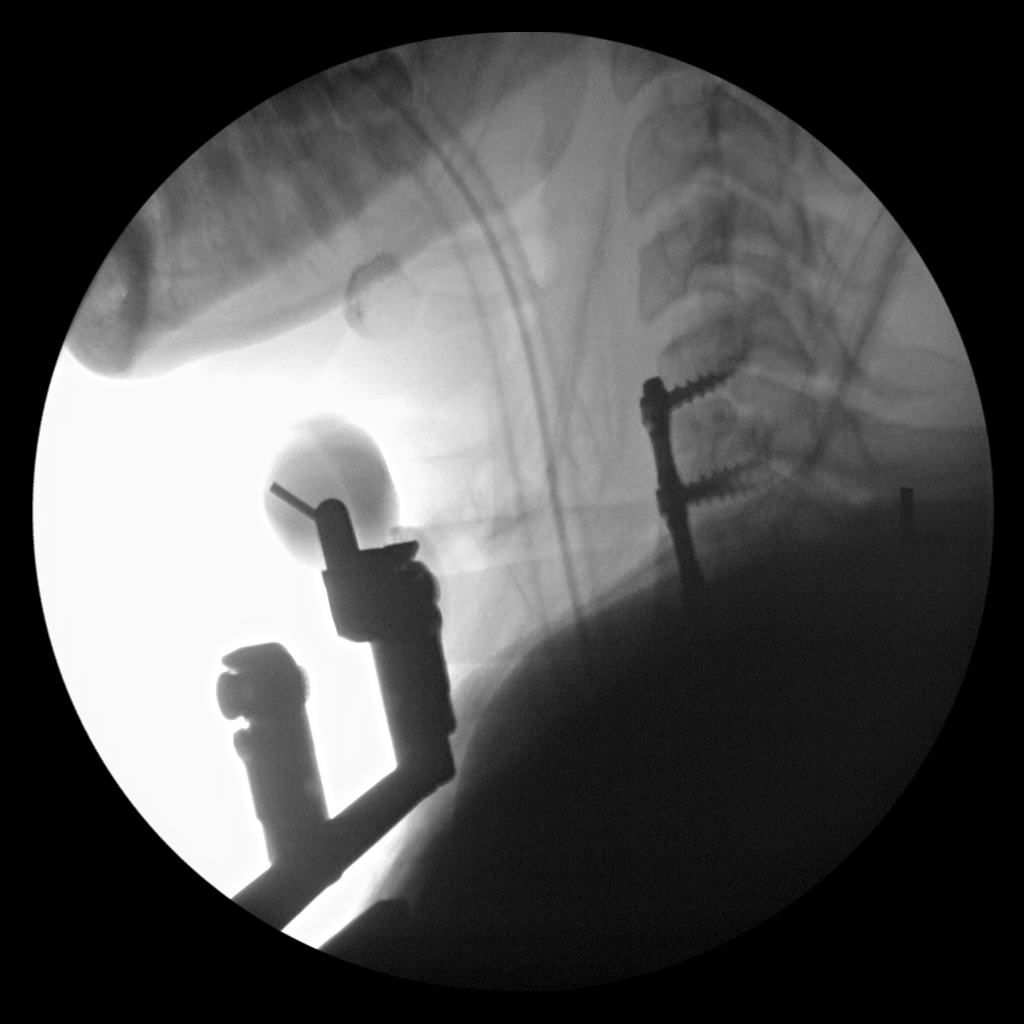

[2 of 2 positions shown; findings below may reference images not displayed]

FINDINGS: Two lateral view intraoperative fluoroscopic images of the cervical
spine are submitted. On the initial provided image, retractors and a
surgical sponge/packing material project ventral to the cervical
spine. Also on this image, a metallic instrument is present with tip
projecting in the C6-C7 interspace. On the later provided image,
retractors again project ventral to the cervical spine. Also on this
image, there is ACDF hardware (ventral plate and screws) extending
from the C5 level caudally at least to the C7 level (the more caudal
levels are obscured by superimposition of the patient's shoulders).
Partially visualized ET tube on both provided images.
IMPRESSION: Two lateral view intraoperative fluoroscopic images from reported
C5-T1 ACDF, as described.

## 2021-02-03 SURGERY — ANTERIOR CERVICAL DECOMPRESSION/DISCECTOMY FUSION 3 LEVELS
Anesthesia: General | Site: Wrist

## 2021-02-03 MED ORDER — THROMBIN 5000 UNITS EX SOLR
OROMUCOSAL | Status: DC | PRN
  Administered 2021-02-03: 5 mL via TOPICAL

## 2021-02-03 MED ORDER — LACTATED RINGERS IV SOLN: INTRAVENOUS | Status: DC | PRN

## 2021-02-03 MED ORDER — LIDOCAINE 2% (20 MG/ML) 5 ML SYRINGE
INTRAMUSCULAR | Status: AC
  Filled 2021-02-03: qty 5

## 2021-02-03 MED ORDER — AMISULPRIDE (ANTIEMETIC) 5 MG/2ML IV SOLN: 10.0000 mg | Freq: Once | INTRAVENOUS | Status: DC | PRN

## 2021-02-03 MED ORDER — PROPOFOL 10 MG/ML IV BOLUS
INTRAVENOUS | Status: DC | PRN
  Administered 2021-02-03: 180 mg via INTRAVENOUS

## 2021-02-03 MED ORDER — 0.9 % SODIUM CHLORIDE (POUR BTL) OPTIME
TOPICAL | Status: DC | PRN
  Administered 2021-02-03 (×2): 1000 mL

## 2021-02-03 MED ORDER — BUPIVACAINE HCL (PF) 0.25 % IJ SOLN
INTRAMUSCULAR | Status: AC
  Filled 2021-02-03: qty 30

## 2021-02-03 MED ORDER — SUGAMMADEX SODIUM 200 MG/2ML IV SOLN
INTRAVENOUS | Status: DC | PRN
  Administered 2021-02-03: 200 mg via INTRAVENOUS

## 2021-02-03 MED ORDER — ONDANSETRON HCL 4 MG/2ML IJ SOLN
INTRAMUSCULAR | Status: AC
  Filled 2021-02-03: qty 2

## 2021-02-03 MED ORDER — ROCURONIUM BROMIDE 10 MG/ML (PF) SYRINGE
PREFILLED_SYRINGE | INTRAVENOUS | Status: AC
  Filled 2021-02-03: qty 10

## 2021-02-03 MED ORDER — CEFAZOLIN SODIUM 1 G IJ SOLR
INTRAMUSCULAR | Status: AC
  Filled 2021-02-03: qty 20

## 2021-02-03 MED ORDER — DEXAMETHASONE SODIUM PHOSPHATE 10 MG/ML IJ SOLN
INTRAMUSCULAR | Status: DC | PRN
  Administered 2021-02-03: 10 mg via INTRAVENOUS

## 2021-02-03 MED ORDER — DEXMEDETOMIDINE (PRECEDEX) IN NS 20 MCG/5ML (4 MCG/ML) IV SYRINGE
PREFILLED_SYRINGE | INTRAVENOUS | Status: DC | PRN
  Administered 2021-02-03: 4 ug via INTRAVENOUS
  Administered 2021-02-03: 8 ug via INTRAVENOUS

## 2021-02-03 MED ORDER — PHENYLEPHRINE 40 MCG/ML (10ML) SYRINGE FOR IV PUSH (FOR BLOOD PRESSURE SUPPORT)
PREFILLED_SYRINGE | INTRAVENOUS | Status: DC | PRN
  Administered 2021-02-03 (×4): 80 ug via INTRAVENOUS

## 2021-02-03 MED ORDER — FENTANYL CITRATE (PF) 250 MCG/5ML IJ SOLN
INTRAMUSCULAR | Status: DC | PRN
  Administered 2021-02-03 (×6): 50 ug via INTRAVENOUS
  Administered 2021-02-03: 100 ug via INTRAVENOUS

## 2021-02-03 MED ORDER — DEXMEDETOMIDINE (PRECEDEX) IN NS 20 MCG/5ML (4 MCG/ML) IV SYRINGE
PREFILLED_SYRINGE | INTRAVENOUS | Status: AC
  Filled 2021-02-03: qty 5

## 2021-02-03 MED ORDER — ONDANSETRON HCL 4 MG/2ML IJ SOLN
INTRAMUSCULAR | Status: DC | PRN
  Administered 2021-02-03 (×2): 4 mg via INTRAVENOUS

## 2021-02-03 MED ORDER — PHENYLEPHRINE 40 MCG/ML (10ML) SYRINGE FOR IV PUSH (FOR BLOOD PRESSURE SUPPORT)
PREFILLED_SYRINGE | INTRAVENOUS | Status: AC
  Filled 2021-02-03: qty 10

## 2021-02-03 MED ORDER — BACITRACIN-NEOMYCIN-POLYMYXIN OINTMENT TUBE
1.0000 "application " | TOPICAL_OINTMENT | Freq: Two times a day (BID) | CUTANEOUS | Status: DC
  Administered 2021-02-03 – 2021-02-09 (×10): 1 via TOPICAL
  Filled 2021-02-03 (×2): qty 14

## 2021-02-03 MED ORDER — ROCURONIUM BROMIDE 10 MG/ML (PF) SYRINGE
PREFILLED_SYRINGE | INTRAVENOUS | Status: DC | PRN
  Administered 2021-02-03: 30 mg via INTRAVENOUS
  Administered 2021-02-03: 50 mg via INTRAVENOUS
  Administered 2021-02-03: 60 mg via INTRAVENOUS

## 2021-02-03 MED ORDER — LIDOCAINE-EPINEPHRINE 1 %-1:100000 IJ SOLN
INTRAMUSCULAR | Status: DC | PRN
  Administered 2021-02-03: 5 mL

## 2021-02-03 MED ORDER — THROMBIN 5000 UNITS EX SOLR
CUTANEOUS | Status: AC
  Filled 2021-02-03: qty 5000

## 2021-02-03 MED ORDER — LIDOCAINE-EPINEPHRINE 1 %-1:100000 IJ SOLN
INTRAMUSCULAR | Status: AC
  Filled 2021-02-03: qty 1

## 2021-02-03 MED ORDER — CHLORHEXIDINE GLUCONATE CLOTH 2 % EX PADS
6.0000 | MEDICATED_PAD | Freq: Every day | CUTANEOUS | Status: DC
  Administered 2021-02-04: 6 via TOPICAL

## 2021-02-03 MED ORDER — FENTANYL CITRATE (PF) 250 MCG/5ML IJ SOLN
INTRAMUSCULAR | Status: AC
  Filled 2021-02-03: qty 5

## 2021-02-03 MED ORDER — PROPOFOL 10 MG/ML IV BOLUS
INTRAVENOUS | Status: AC
  Filled 2021-02-03: qty 40

## 2021-02-03 MED ORDER — LIDOCAINE 2% (20 MG/ML) 5 ML SYRINGE
INTRAMUSCULAR | Status: DC | PRN
  Administered 2021-02-03: 60 mg via INTRAVENOUS

## 2021-02-03 MED ORDER — DEXAMETHASONE SODIUM PHOSPHATE 10 MG/ML IJ SOLN
INTRAMUSCULAR | Status: AC
  Filled 2021-02-03: qty 1

## 2021-02-03 MED ORDER — FENTANYL CITRATE (PF) 100 MCG/2ML IJ SOLN
INTRAMUSCULAR | Status: AC
  Filled 2021-02-03: qty 2

## 2021-02-03 MED ORDER — PROPOFOL 500 MG/50ML IV EMUL
INTRAVENOUS | Status: DC | PRN
  Administered 2021-02-03: 30 ug/kg/min via INTRAVENOUS

## 2021-02-03 MED ORDER — MIDAZOLAM HCL 2 MG/2ML IJ SOLN
INTRAMUSCULAR | Status: DC | PRN
  Administered 2021-02-03: 2 mg via INTRAVENOUS

## 2021-02-03 MED ORDER — MIDAZOLAM HCL 2 MG/2ML IJ SOLN
INTRAMUSCULAR | Status: AC
  Filled 2021-02-03: qty 2

## 2021-02-03 MED ORDER — PHENYLEPHRINE HCL-NACL 10-0.9 MG/250ML-% IV SOLN
INTRAVENOUS | Status: DC | PRN
  Administered 2021-02-03: 15 ug/min via INTRAVENOUS

## 2021-02-03 MED ORDER — CEFAZOLIN SODIUM-DEXTROSE 1-4 GM/50ML-% IV SOLN
1.0000 g | Freq: Three times a day (TID) | INTRAVENOUS | Status: AC
  Administered 2021-02-03 – 2021-02-05 (×6): 1 g via INTRAVENOUS
  Filled 2021-02-03 (×6): qty 50

## 2021-02-03 MED ORDER — CEFAZOLIN SODIUM-DEXTROSE 2-3 GM-%(50ML) IV SOLR
INTRAVENOUS | Status: DC | PRN
  Administered 2021-02-03 (×2): 2 g via INTRAVENOUS

## 2021-02-03 MED ORDER — FENTANYL CITRATE (PF) 100 MCG/2ML IJ SOLN
25.0000 ug | INTRAMUSCULAR | Status: DC | PRN
Start: 2021-02-03 — End: 2021-02-03
  Administered 2021-02-03: 50 ug via INTRAVENOUS

## 2021-02-03 MED ORDER — METHOCARBAMOL 500 MG PO TABS
500.0000 mg | ORAL_TABLET | Freq: Four times a day (QID) | ORAL | Status: DC
  Administered 2021-02-03 – 2021-02-07 (×16): 500 mg via ORAL
  Filled 2021-02-03 (×16): qty 1

## 2021-02-03 MED ORDER — ACETAMINOPHEN 325 MG PO TABS
650.0000 mg | ORAL_TABLET | Freq: Four times a day (QID) | ORAL | Status: DC
  Administered 2021-02-03 – 2021-02-08 (×19): 650 mg via ORAL
  Filled 2021-02-03 (×19): qty 2

## 2021-02-03 MED ORDER — OXYCODONE HCL 5 MG PO TABS
2.5000 mg | ORAL_TABLET | ORAL | Status: DC | PRN
  Administered 2021-02-03 (×2): 5 mg via ORAL
  Filled 2021-02-03 (×2): qty 1

## 2021-02-03 SURGICAL SUPPLY — 81 items
BAND RUBBER #18 3X1/16 STRL (MISCELLANEOUS) ×6 IMPLANT
BIT DRILL CALIB COMP FT 2.2 (BIT) ×3 IMPLANT
BLADE SURG 11 STRL SS (BLADE) ×3 IMPLANT
BNDG ELASTIC 3X5.8 VLCR STR LF (GAUZE/BANDAGES/DRESSINGS) IMPLANT
BNDG ELASTIC 4X5.8 VLCR STR LF (GAUZE/BANDAGES/DRESSINGS) ×3 IMPLANT
BNDG ESMARK 4X9 LF (GAUZE/BANDAGES/DRESSINGS) ×3 IMPLANT
BNDG GAUZE ELAST 4 BULKY (GAUZE/BANDAGES/DRESSINGS) IMPLANT
BUR MATCHSTICK NEURO 3.0 LAGG (BURR) ×3 IMPLANT
CANISTER SUCT 3000ML PPV (MISCELLANEOUS) ×6 IMPLANT
CORD BIPOLAR FORCEPS 12FT (ELECTRODE) ×3 IMPLANT
CUFF TOURN SGL QUICK 18X4 (TOURNIQUET CUFF) ×3 IMPLANT
CUFF TOURN SGL QUICK 24 (TOURNIQUET CUFF)
CUFF TRNQT CYL 24X4X16.5-23 (TOURNIQUET CUFF) IMPLANT
DERMABOND ADVANCED (GAUZE/BANDAGES/DRESSINGS) ×1
DERMABOND ADVANCED .7 DNX12 (GAUZE/BANDAGES/DRESSINGS) ×2 IMPLANT
DRAIN TLS ROUND 10FR (DRAIN) IMPLANT
DRAPE C-ARM 42X72 X-RAY (DRAPES) ×6 IMPLANT
DRAPE HALF SHEET 40X57 (DRAPES) ×6 IMPLANT
DRAPE LAPAROTOMY 100X72 PEDS (DRAPES) ×3 IMPLANT
DRAPE MICROSCOPE LEICA (MISCELLANEOUS) ×3 IMPLANT
DRAPE OEC MINIVIEW 54X84 (DRAPES) ×3 IMPLANT
DRSG ADAPTIC 3X8 NADH LF (GAUZE/BANDAGES/DRESSINGS) ×3 IMPLANT
DURAPREP 6ML APPLICATOR 50/CS (WOUND CARE) ×3 IMPLANT
ELECT COATED BLADE 2.86 ST (ELECTRODE) ×3 IMPLANT
ELECT REM PT RETURN 9FT ADLT (ELECTROSURGICAL) ×3
ELECTRODE REM PT RTRN 9FT ADLT (ELECTROSURGICAL) ×2 IMPLANT
GAUZE SPONGE 4X4 12PLY STRL (GAUZE/BANDAGES/DRESSINGS) ×3 IMPLANT
GAUZE XEROFORM 1X8 LF (GAUZE/BANDAGES/DRESSINGS) IMPLANT
GAUZE XEROFORM 5X9 LF (GAUZE/BANDAGES/DRESSINGS) ×3 IMPLANT
GLOVE BIOGEL PI IND STRL 7.5 (GLOVE) ×6 IMPLANT
GLOVE BIOGEL PI INDICATOR 7.5 (GLOVE) ×3
GLOVE ECLIPSE 7.5 STRL STRAW (GLOVE) ×6 IMPLANT
GLOVE OPTIFIT SS 7.5 STRL LX (GLOVE) ×9 IMPLANT
GLOVE SS BIOGEL STRL SZ 8 (GLOVE) IMPLANT
GLOVE SUPERSENSE BIOGEL SZ 8 (GLOVE)
GOWN STRL REUS W/ TWL LRG LVL3 (GOWN DISPOSABLE) ×4 IMPLANT
GOWN STRL REUS W/ TWL XL LVL3 (GOWN DISPOSABLE) ×6 IMPLANT
GOWN STRL REUS W/TWL 2XL LVL3 (GOWN DISPOSABLE) ×6 IMPLANT
GOWN STRL REUS W/TWL LRG LVL3 (GOWN DISPOSABLE) ×2
GOWN STRL REUS W/TWL XL LVL3 (GOWN DISPOSABLE) ×3
GUIDEWIRE TROCAR NI .034X5.91 (WIRE) ×12 IMPLANT
GUIDEWIRE TROCAR NITI 1.1X5.91 (WIRE) ×3 IMPLANT
HEMOSTAT POWDER KIT SURGIFOAM (HEMOSTASIS) ×3 IMPLANT
KIT BASIN OR (CUSTOM PROCEDURE TRAY) ×9 IMPLANT
KIT TURNOVER KIT B (KITS) ×6 IMPLANT
NEEDLE HYPO 22GX1.5 SAFETY (NEEDLE) ×3 IMPLANT
NEEDLE SPNL 18GX3.5 QUINCKE PK (NEEDLE) ×3 IMPLANT
NS IRRIG 1000ML POUR BTL (IV SOLUTION) ×6 IMPLANT
PACK LAMINECTOMY NEURO (CUSTOM PROCEDURE TRAY) ×3 IMPLANT
PACK ORTHO EXTREMITY (CUSTOM PROCEDURE TRAY) ×3 IMPLANT
PAD CAST 3X4 CTTN HI CHSV (CAST SUPPLIES) IMPLANT
PAD CAST 4YDX4 CTTN HI CHSV (CAST SUPPLIES) IMPLANT
PADDING CAST COTTON 3X4 STRL (CAST SUPPLIES)
PADDING CAST COTTON 4X4 STRL (CAST SUPPLIES)
PIN DISTRACTION 14MM (PIN) ×6 IMPLANT
PLATE ATLANTIS ELITE 52 (Plate) ×3 IMPLANT
SCREW CANN COMP FT 2.5X22 (Screw) ×3 IMPLANT
SCREW CANN COMP FT 2.5X36 (Screw) ×3 IMPLANT
SCREW SELF TAP VAR 4.0X13 (Screw) ×24 IMPLANT
SOL PREP POV-IOD 4OZ 10% (MISCELLANEOUS) ×6 IMPLANT
SPACER BONE CORNERSTONE 6X14 (Orthopedic Implant) ×9 IMPLANT
SPONGE INTESTINAL PEANUT (DISPOSABLE) ×3 IMPLANT
SPONGE LAP 4X18 RFD (DISPOSABLE) IMPLANT
STAPLER VISISTAT 35W (STAPLE) ×3 IMPLANT
SUT MNCRL AB 3-0 PS2 18 (SUTURE) ×3 IMPLANT
SUT MNCRL AB 4-0 PS2 18 (SUTURE) ×3 IMPLANT
SUT PROLENE 3 0 PS 1 (SUTURE) ×3 IMPLANT
SUT PROLENE 3 0 PS 2 (SUTURE) ×6 IMPLANT
SUT VIC AB 3-0 FS2 27 (SUTURE) IMPLANT
SUT VIC AB 3-0 SH 8-18 (SUTURE) ×6 IMPLANT
SUT VIC AB 4-0 P-3 18XBRD (SUTURE) ×2 IMPLANT
SUT VIC AB 4-0 P3 18 (SUTURE) ×1
SYR CONTROL 10ML LL (SYRINGE) IMPLANT
SYSTEM CHEST DRAIN TLS 7FR (DRAIN) IMPLANT
TOWEL GREEN STERILE (TOWEL DISPOSABLE) ×6 IMPLANT
TOWEL GREEN STERILE FF (TOWEL DISPOSABLE) ×6 IMPLANT
TRAY FOLEY MTR SLVR 16FR STAT (SET/KITS/TRAYS/PACK) ×3 IMPLANT
TUBE CONNECTING 12X1/4 (SUCTIONS) IMPLANT
TUBE EVACUATION TLS (MISCELLANEOUS) ×3 IMPLANT
UNDERPAD 30X36 HEAVY ABSORB (UNDERPADS AND DIAPERS) ×3 IMPLANT
WATER STERILE IRR 1000ML POUR (IV SOLUTION) ×3 IMPLANT
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
## 2021-02-03 NOTE — Anesthesia Postprocedure Evaluation (Signed)
Anesthesia Post Note  Patient: Radiation protection practitioner  Procedure(s) Performed: CERVICAL FIVE-SIX, CERVICAL SIX-SEVEN, CERVICAL SEVEN-THORACIC ONE ANTERIOR CERVICAL DECOMPRESSION/DISCECTOMY FUSION (N/A Spine Cervical) OPEN REDUCTION FRACTURE REPAIR LEFT WRIST AND RADIUS, LUNATE LIGAMENTS OF WRIST, OPEN CARPAL TUNNEL RELEASE, (Left Wrist)     Patient location during evaluation: PACU Anesthesia Type: General Level of consciousness: awake and alert Pain management: pain level controlled Vital Signs Assessment: post-procedure vital signs reviewed and stable Respiratory status: spontaneous breathing, nonlabored ventilation, respiratory function stable and patient connected to nasal cannula oxygen Cardiovascular status: blood pressure returned to baseline and stable Postop Assessment: no apparent nausea or vomiting Anesthetic complications: no   No complications documented.  Last Vitals:  Vitals:   02/03/21 1545 02/03/21 1600  BP: 127/90 (!) 150/99  Pulse: 70 68  Resp: 14 16  Temp:  (!) 36.2 C  SpO2: 93% 99%    Last Pain:  Vitals:   02/03/21 1545  TempSrc:   PainSc: Asleep                 Kennieth Rad

## 2021-02-03 NOTE — Transfer of Care (Signed)
Immediate Anesthesia Transfer of Care Note  Patient: Radiation protection practitioner  Procedure(s) Performed: CERVICAL FIVE-SIX, CERVICAL SIX-SEVEN, CERVICAL SEVEN-THORACIC ONE ANTERIOR CERVICAL DECOMPRESSION/DISCECTOMY FUSION (N/A Spine Cervical) OPEN REDUCTION FRACTURE REPAIR LEFT WRIST AND RADIUS, LUNATE LIGAMENTS OF WRIST, OPEN CARPAL TUNNEL RELEASE, (Left Wrist)  Patient Location: PACU  Anesthesia Type:General  Level of Consciousness: sedated and drowsy  Airway & Oxygen Therapy: Patient Spontanous Breathing and Patient connected to face mask oxygen  Post-op Assessment: Report given to RN and Post -op Vital signs reviewed and stable  Post vital signs: Reviewed and stable  Last Vitals:  Vitals Value Taken Time  BP 135/95 02/03/21 1459  Temp    Pulse 90 02/03/21 1505  Resp 16 02/03/21 1505  SpO2 96 % 02/03/21 1505  Vitals shown include unvalidated device data.  Last Pain:  Vitals:   02/03/21 0400  TempSrc: Axillary  PainSc: Asleep      Patients Stated Pain Goal: 3 (02/03/21 0045)  Complications: No complications documented.

## 2021-02-03 NOTE — TOC CAGE-AID Note (Signed)
Transition of Care [facility]) - CAGE-AID Screening   Patient Details  Name: David Mays MRN: [id number] Date of Birth: December 04, 1995   Hewitt Shorts, RN rauma response Nurse  02/03/2021, 7:07 PM   Clinical Narrative:  Pt had lengthy surgery today- recent post op. Will need to follow up.   CAGE-AID Screening: Substance Abuse Screening unable to be completed due to: : Patient unable to participate (pt is recent post op)

## 2021-02-03 NOTE — ED Notes (Signed)
MRI completed, RN and pt to floor.

## 2021-02-03 NOTE — Progress Notes (Signed)
Patients belongings at bedside. 1 black jacket 1 pair of black shoes and pair of socks 1 phone charger 1 pair of jeans and belt 1 cellphone
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
## 2021-02-03 NOTE — TOC CAGE-AID Note (Deleted)
Transition of Care [facility]) - CAGE-AID Screening   Patient Details  Name: David Mays MRN: [id number] Date of Birth: 01/09/1996   Hewitt Shorts, RN  Trauma Response Nurse  02/03/2021, 7:00 PM   Clinical Narrative:  Pt returned from OR intubated, Unable to participate today  CAGE-AID Screening: Substance Abuse Screening unable to be completed due to: : Patient unable to participate (pt is intubated post op)
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
## 2021-02-03 NOTE — Anesthesia Procedure Notes (Signed)
Procedure Name: Intubation Date/Time: 02/03/2021 9:32 AM Performed by: Samara Deist, CRNA Pre-anesthesia Checklist: Patient identified, Emergency Drugs available, Suction available, Patient being monitored and Timeout performed Patient Re-evaluated:Patient Re-evaluated prior to induction Oxygen Delivery Method: Circle system utilized Preoxygenation: Pre-oxygenation with 100% oxygen Induction Type: IV induction Ventilation: Mask ventilation without difficulty Laryngoscope Size: Glidescope and 4 Grade View: Grade I Tube type: Oral Tube size: 7.5 mm Number of attempts: 1 Airway Equipment and Method: Video-laryngoscopy Placement Confirmation: ETT inserted through vocal cords under direct vision,  positive ETCO2 and breath sounds checked- equal and bilateral Secured at: 22 cm Tube secured with: Tape Dental Injury: Teeth and Oropharynx as per pre-operative assessment  Comments: Hard cervical collar remains in placed for induction, neck not manipulated remains neutral

## 2021-02-03 NOTE — Anesthesia Preprocedure Evaluation (Signed)
Anesthesia Evaluation  Patient identified by MRN, date of birth, ID band Patient awake    Reviewed: Allergy & Precautions, NPO status , Patient's Chart, lab work & pertinent test results  Airway Mallampati: III     Mouth opening: Limited Mouth Opening Comment: C-collar Dental  (+) Dental Advisory Given   Pulmonary neg pulmonary ROS,    breath sounds clear to auscultation       Cardiovascular negative cardio ROS   Rhythm:Regular Rate:Normal     Neuro/Psych Cervical spine fx    GI/Hepatic negative GI ROS, (+)     substance abuse  marijuana use,   Endo/Other  negative endocrine ROS  Renal/GU negative Renal ROS     Musculoskeletal   Abdominal   Peds  Hematology negative hematology ROS (+)   Anesthesia Other Findings   Reproductive/Obstetrics                             Lab Results  Component Value Date   WBC 12.3 (H) 02/02/2021   HGB 14.6 02/02/2021   HCT 43.0 02/02/2021   MCV 91.5 02/02/2021   PLT 274 02/02/2021   Lab Results  Component Value Date   CREATININE 1.00 02/02/2021   BUN 10 02/02/2021   NA 138 02/02/2021   K 3.1 (L) 02/02/2021   CL 102 02/02/2021   CO2 23 02/02/2021    Anesthesia Physical Anesthesia Plan  ASA: II  Anesthesia Plan: General   Post-op Pain Management:    Induction: Intravenous  PONV Risk Score and Plan: 2 and Ondansetron, Dexamethasone and Treatment may vary due to age or medical condition  Airway Management Planned: Oral ETT and Video Laryngoscope Planned  Additional Equipment:   Intra-op Plan:   Post-operative Plan: Extubation in OR  Informed Consent: I have reviewed the patients History and Physical, chart, labs and discussed the procedure including the risks, benefits and alternatives for the proposed anesthesia with the patient or authorized representative who has indicated his/her understanding and acceptance.     Dental  advisory given  Plan Discussed with: CRNA  Anesthesia Plan Comments:         Anesthesia Quick Evaluation

## 2021-02-04 ENCOUNTER — Encounter (HOSPITAL_COMMUNITY): Payer: Self-pay | Admitting: Neurological Surgery

## 2021-02-04 LAB — CBC
HCT: 33.6 % — ABNORMAL LOW (ref 39.0–52.0)
Hemoglobin: 11.4 g/dL — ABNORMAL LOW (ref 13.0–17.0)
MCH: 30 pg (ref 26.0–34.0)
MCHC: 33.9 g/dL (ref 30.0–36.0)
MCV: 88.4 fL (ref 80.0–100.0)
Platelets: 230 10*3/uL (ref 150–400)
RBC: 3.8 MIL/uL — ABNORMAL LOW (ref 4.22–5.81)
RDW: 13.5 % (ref 11.5–15.5)
WBC: 14.7 10*3/uL — ABNORMAL HIGH (ref 4.0–10.5)
nRBC: 0 % (ref 0.0–0.2)

## 2021-02-04 LAB — COMPREHENSIVE METABOLIC PANEL
ALT: 21 U/L (ref 0–44)
AST: 46 U/L — ABNORMAL HIGH (ref 15–41)
Albumin: 3.5 g/dL (ref 3.5–5.0)
Alkaline Phosphatase: 40 U/L (ref 38–126)
Anion gap: 6 (ref 5–15)
BUN: <5 mg/dL — ABNORMAL LOW (ref 6–20)
CO2: 25 mmol/L (ref 22–32)
Calcium: 9 mg/dL (ref 8.9–10.3)
Chloride: 108 mmol/L (ref 98–111)
Creatinine, Ser: 0.74 mg/dL (ref 0.61–1.24)
GFR, Estimated: >60 mL/min (ref >60)
Glucose, Bld: 123 mg/dL — ABNORMAL HIGH (ref 70–99)
Potassium: 4.1 mmol/L (ref 3.5–5.1)
Sodium: 139 mmol/L (ref 135–145)
Total Bilirubin: 0.7 mg/dL (ref 0.3–1.2)
Total Protein: 6.3 g/dL — ABNORMAL LOW (ref 6.5–8.1)

## 2021-02-04 LAB — HIV ANTIBODY (ROUTINE TESTING W REFLEX): HIV Screen 4th Generation wRfx: NONREACTIVE

## 2021-02-04 MED ORDER — OXYCODONE HCL 5 MG PO TABS
5.0000 mg | ORAL_TABLET | ORAL | Status: DC | PRN
  Administered 2021-02-04 – 2021-02-07 (×11): 10 mg via ORAL
  Administered 2021-02-07: 5 mg via ORAL
  Administered 2021-02-08 – 2021-02-09 (×5): 10 mg via ORAL
  Filled 2021-02-04 (×8): qty 2
  Filled 2021-02-04: qty 1
  Filled 2021-02-04 (×9): qty 2

## 2021-02-04 MED ORDER — HYDROMORPHONE HCL 1 MG/ML IJ SOLN
1.0000 mg | INTRAMUSCULAR | Status: DC | PRN
  Administered 2021-02-06 – 2021-02-08 (×4): 1 mg via INTRAVENOUS
  Filled 2021-02-04 (×4): qty 1

## 2021-02-04 MED ORDER — SENNA 8.6 MG PO TABS
1.0000 | ORAL_TABLET | Freq: Two times a day (BID) | ORAL | Status: DC
  Administered 2021-02-04 – 2021-02-09 (×10): 8.6 mg via ORAL
  Filled 2021-02-04 (×10): qty 1

## 2021-02-04 MED ORDER — MAGNESIUM HYDROXIDE 400 MG/5ML PO SUSP: 30.0000 mL | Freq: Every day | ORAL | Status: DC | PRN
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
## 2021-02-04 NOTE — Progress Notes (Signed)
Patient ID: David Mays, male   DOB: Feb 25, 1996, 25 y.o.   MRN: [id number] Patient seen at bedside.  Patient has intact motion to the fingers he is flexing extending reasonably well.  He has good refill.  He is maximally elevated.  I discussed with the patient his findings surgically and the postop care plan.  We will continue elevation range of motion.  We will plan to continue immobilization for up to 6 to 8 weeks and transition him to a removable cast in 2 weeks.  At present time our main goal with the extremity is protection motion and edema control and hopefully see resolution of the contusive and neuro compressive phenomenon about the median nerve.  The patient looks very well at this point status post surgical reconstruction.  I reviewed this with him at length and the findings.  All questions were addressed at bedside.  I discussed his care with nursing staff as well.  Gramig MD
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
## 2021-02-04 NOTE — Progress Notes (Signed)
   02/04/21 1032  Clinical Encounter Type  Visited With Patient  Visit Type Initial;Spiritual support;Social support  Referral From Nurse  Consult/Referral To Chaplain  Spiritual Encounters  Spiritual Needs Prayer;Emotional  Stress Factors  Patient Stress Factors Family relationships;Financial concerns;Health changes;Loss of control   Chaplain responded to consult request for prayer. Pt provided a life review. He stated he is used to emotional pain but physical pain is new to him. He stated he is unable to go to shelters/collect his disability because his mother won't let him access his papers/documents. Pt is concerned about housing after discharge. Chaplain engaged active listening and provided emotional and spiritual support. Chaplain prayed with Pt. Chaplain will check back tomorrow.    This note was prepared by Chaplain Resident, Tacy Learn, MDiv. Chaplain remains available as needed through the on-call pager: [phone].
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
## 2021-02-05 ENCOUNTER — Other Ambulatory Visit: Payer: Self-pay | Admitting: Physician Assistant

## 2021-02-05 LAB — CBC
HCT: 30.9 % — ABNORMAL LOW (ref 39.0–52.0)
Hemoglobin: 10.3 g/dL — ABNORMAL LOW (ref 13.0–17.0)
MCH: 29.9 pg (ref 26.0–34.0)
MCHC: 33.3 g/dL (ref 30.0–36.0)
MCV: 89.8 fL (ref 80.0–100.0)
Platelets: 208 10*3/uL (ref 150–400)
RBC: 3.44 MIL/uL — ABNORMAL LOW (ref 4.22–5.81)
RDW: 13.5 % (ref 11.5–15.5)
WBC: 10.5 10*3/uL (ref 4.0–10.5)
nRBC: 0 % (ref 0.0–0.2)

## 2021-02-05 MED ORDER — ENOXAPARIN SODIUM 30 MG/0.3ML ~~LOC~~ SOLN
30.0000 mg | Freq: Two times a day (BID) | SUBCUTANEOUS | Status: DC
  Administered 2021-02-05 – 2021-02-09 (×9): 30 mg via SUBCUTANEOUS
  Filled 2021-02-05 (×9): qty 0.3

## 2021-02-05 MED ORDER — POLYETHYLENE GLYCOL 3350 17 G PO PACK: 17.0000 g | PACK | Freq: Every day | ORAL | 0 refills | Status: DC | PRN

## 2021-02-05 MED ORDER — GABAPENTIN 600 MG PO TABS: 300.0000 mg | ORAL_TABLET | Freq: Three times a day (TID) | ORAL | 0 refills | Status: DC

## 2021-02-05 MED ORDER — METHOCARBAMOL 500 MG PO TABS: 500.0000 mg | ORAL_TABLET | Freq: Four times a day (QID) | ORAL | 0 refills | Status: DC | PRN

## 2021-02-05 MED ORDER — GABAPENTIN 600 MG PO TABS
300.0000 mg | ORAL_TABLET | Freq: Three times a day (TID) | ORAL | Status: DC
  Administered 2021-02-05 – 2021-02-09 (×13): 300 mg via ORAL
  Filled 2021-02-05 (×13): qty 1

## 2021-02-05 MED ORDER — ACETAMINOPHEN 325 MG PO TABS: 650.0000 mg | ORAL_TABLET | Freq: Four times a day (QID) | ORAL | Status: DC | PRN

## 2021-02-05 MED ORDER — OXYCODONE HCL 10 MG PO TABS: 5.0000 mg | ORAL_TABLET | Freq: Four times a day (QID) | ORAL | 0 refills | Status: DC | PRN

## 2021-02-05 MED ORDER — POLYETHYLENE GLYCOL 3350 17 G PO PACK
17.0000 g | PACK | Freq: Every day | ORAL | Status: DC
  Administered 2021-02-05 – 2021-02-06 (×2): 17 g via ORAL
  Filled 2021-02-05 (×2): qty 1

## 2021-02-05 MED FILL — oxyCODONE HCL 10 MG TABS: 10 | 6 days supply | Qty: 25 | Fill #0

## 2021-02-05 MED FILL — METHOCARBAMOL 500 MG TABS: 500 | 7 days supply | Qty: 30 | Fill #0

## 2021-02-05 MED FILL — GABAPENTIN 600 MG TABLET: 600 | 30 days supply | Qty: 50 | Fill #0

## 2021-02-05 NOTE — Discharge Instructions (Signed)
Cast or Splint Care, Adult Casts and splints are supports that are worn to protect broken bones and other injuries. A cast or splint may hold a bone still and in the correct position while it heals. Casts and splints may also help to ease pain, swelling, and muscle spasms. A cast is a hardened support that is usually made of fiberglass or plaster. It is custom-fit to the body and offers more protection than a splint. Most casts cannot be taken off and put back on. A splint is a type of soft support that is usually made from cloth and elastic. It can be adjusted or taken off as needed. Often, splints are used on broken bones at first. Later, a cast can replace the splint after the swelling goes down. What are the risks? In some cases, wearing a cast or splint can cause a reduced blood supply to the wrist or hand or to the foot and toes. This can happen if there is a lot of swelling or if the cast or splint is too tight. Limited blood supply can cause a problem called compartment syndrome. This can lead to lasting damage. Symptoms include:  Pain that is getting worse.  Numbness and tingling.  Changes in skin color, including paleness or a bluish color.  Cold fingers or toes. Other problems from wearing a cast or splint can include:  Skin irritation that can cause: ? Itching. ? Rash. ? Skin sores. ? Skin infection.  Limb stiffness or weakness. How to care for your cast  Check the skin around it every day. Tell your doctor about any concerns.  Do not stick anything inside it to scratch your skin.  You may put lotion on dry skin around the edges of the cast. Do not put lotion on the skin underneath it.  Keep it clean and dry.   How to care for your splint  Wear the splint as told by your doctor. Take it off only as told by your doctor.  Check the skin around it every day. Tell your doctor about any concerns.  Loosen it if your fingers or toes tingle, get numb, or turn cold and  blue.  Keep it clean and dry. Clean your splint as told by your doctor. Use mild soap and water and let it air-dry. Do not use heat on the splint. Follow these instructions at home: Bathing  Do not take baths, swim, or use a hot tub until your doctor approves. Ask your doctor if you may take showers. You may only be allowed to take sponge baths.  If the cast or splint is not waterproof: ? Do not let it get wet. ? Cover it with a watertight covering when you take a bath or a shower. Managing pain, stiffness, and swelling  If told, put ice on the affected area. To do this: ? If you have a cast or splint that can be taken off, take it off as told by your doctor. ? Put ice in a plastic bag. ? Place a towel between your skin and the bag or between your cast and the bag. ? Leave the ice on for 20 minutes, 2-3 times a day.  Move your fingers or toes often.  Raise (elevate) the injured area above the level of your heart while you are sitting or lying down.   Safety  Do not use your injured leg or foot to support your body weight until your doctor says that you can.  Use crutches or  other helpful (assistive) devices as told by your doctor.  Ask your doctor when it is safe to drive if you have a cast or splint on part of your body. General instructions  Do not put pressure on any part of the cast or splint until it is fully hardened. This may take many hours.  Take over-the-counter and prescription medicines only as told by your doctor.  Do not use any products that contain nicotine or tobacco, such as cigarettes, e-cigarettes, and chewing tobacco. These can delay healing. If you need help quitting, ask your doctor.  Return to your normal activities as told by your doctor. Ask your doctor what activities are safe for you.  Keep all follow-up visits as told by your doctor. This is important. Contact a doctor if:  The skin around the cast or splint gets red or raw.  The skin under the  cast is very itchy or painful.  Your cast or splint: ? Gets damaged. ? Feels very uncomfortable. ? Is too tight or too loose.  Your cast becomes wet or it starts to have a soft spot or area.  There is a bad smell coming from under your cast.  You get an object stuck under your cast. Get help right away if:  You get any symptoms of compartment syndrome, such as: ? Very bad pain or pressure under the cast. ? Numbness, tingling, coldness, or pale or bluish skin.  The part of your body above or below the cast is swollen, and it turns a different color (is discolored).  You cannot feel or move your fingers or toes.  Your pain gets worse.  There is fluid leaking through the cast.  You have trouble breathing or shortness of breath.  You have chest pain. Summary  Casts and splints are worn to protect broken bones and other injuries.  Most casts cannot be taken off, and most splints can be taken off.  Keep your cast or splint clean and dry.  Take off your cast or splint only as told by your doctor.  Get help right away if you have very bad pain, numbness, tingling, or skin that turns cold or another color. This information is not intended to replace advice given to you by your health care provider. Make sure you discuss any questions you have with your health care provider. Document Revised: 07/18/2019 Document Reviewed: 07/18/2019 Elsevier Patient Education  2021 [employer].
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
## 2021-02-05 NOTE — Progress Notes (Signed)
   02/05/21 1432  Clinical Encounter Type  Visited With Patient  Visit Type Follow-up;Social support  Spiritual Encounters  Spiritual Needs Emotional   Chaplain followed-up on yesterday's encounter. Pt was able to speak with his case manager about his documents. Pt appeared sleepy and stated he hadn't been able to rest and that both his arms were hurting. Chaplain engaged active listening and provided emotional support. Pt also stated he was hoping to get a sling because the blue support device was uncomfortable. Chaplain shared Pt's pain concerns with his nurse, Joni Reining. Chaplain remains available.  This note was prepared by Chaplain Resident, Tacy Learn, MDiv. Chaplain remains available as needed through the on-call pager: [phone].
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
## 2021-02-06 LAB — CBC
HCT: 35 % — ABNORMAL LOW (ref 39.0–52.0)
Hemoglobin: 11.7 g/dL — ABNORMAL LOW (ref 13.0–17.0)
MCH: 30.1 pg (ref 26.0–34.0)
MCHC: 33.4 g/dL (ref 30.0–36.0)
MCV: 90 fL (ref 80.0–100.0)
Platelets: 268 10*3/uL (ref 150–400)
RBC: 3.89 MIL/uL — ABNORMAL LOW (ref 4.22–5.81)
RDW: 13.3 % (ref 11.5–15.5)
WBC: 9.4 10*3/uL (ref 4.0–10.5)
nRBC: 0 % (ref 0.0–0.2)

## 2021-02-06 MED ORDER — POLYETHYLENE GLYCOL 3350 17 G PO PACK
17.0000 g | PACK | Freq: Two times a day (BID) | ORAL | Status: DC
  Administered 2021-02-06 – 2021-02-09 (×6): 17 g via ORAL
  Filled 2021-02-06 (×5): qty 1
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
## 2021-02-07 NOTE — Plan of Care (Signed)

## 2021-02-08 MED ORDER — ACETAMINOPHEN 500 MG PO TABS
1000.0000 mg | ORAL_TABLET | Freq: Four times a day (QID) | ORAL | Status: DC
Start: 2021-02-08 — End: 2021-02-10
  Administered 2021-02-08 – 2021-02-09 (×4): 1000 mg via ORAL
  Filled 2021-02-08 (×5): qty 2

## 2021-02-08 MED ORDER — HYDROMORPHONE HCL 1 MG/ML IJ SOLN
0.5000 mg | INTRAMUSCULAR | Status: DC | PRN
  Administered 2021-02-09: 0.5 mg via INTRAVENOUS
  Filled 2021-02-08: qty 1

## 2021-02-08 MED ORDER — METHOCARBAMOL 750 MG PO TABS
750.0000 mg | ORAL_TABLET | Freq: Four times a day (QID) | ORAL | Status: DC
  Administered 2021-02-08 – 2021-02-09 (×5): 750 mg via ORAL
  Filled 2021-02-08 (×5): qty 1

## 2021-02-08 NOTE — Progress Notes (Signed)
   02/08/21 1530  Clinical Encounter Type  Visited With Patient  Visit Type Follow-up;Social support;Spiritual support  Spiritual Encounters  Spiritual Needs Emotional;Prayer  Stress Factors  Patient Stress Factors Family relationships;Exhausted;Health changes   Chaplain followed-up with Pt. Pt expressed that he had a potential discharge tomorrow. He is concerned about his discharge plans, but hopes they work out. He stated he will most likely sleep on a mat at his boss's house [facility] [place]).   Pt got emotional when thinking about and talking about his past. Pt is overwhelmed by the roadblocks to getting official copies of his social security card, resident card, etc saying "I've been trying to get them for six years." Chaplain engaged active listening and provided emotional, social, and spiritual support. Chaplain prayed with Pt. Chaplain will attempt to follow-up before Pt's discharge tomorrow.     This note was prepared by Chaplain Resident, Tacy Learn, MDiv. Chaplain remains available as needed through the on-call pager: [phone].
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
## 2021-02-08 NOTE — Progress Notes (Signed)
Pt removed [place] J Collar himself for a break.
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
## 2021-02-08 NOTE — TOC Progression Note (Signed)
Transition of Care [facility]) - Progression Note    Patient Details  Name: David Mays MRN: [id number] Date of Birth: 31-Dec-1995  Transition [facility] CM/SW Contact  Glennon Mac, RN Phone Number: 02/08/2021, 1155  Clinical Narrative: Spoke with "[name]", who is patient's friend's wife.  Patient states that [name] and "[name]" are like family to him.  Diane confirms that patient can come and stay with them until he is healed up, and able to go out on his own.  She states that they will be able to take him home tomorrow. Will make referral for outpatient occupational therapy.  PT recommending cane for home use.  Will refer to [employer] [facility] for recommended DME.    Expected Discharge Plan: OP Rehab Barriers to Discharge: Continued Medical Work up  Expected Discharge Plan and Services Expected Discharge Plan: OP Rehab   Discharge Planning Services: CM Consult,Indigent Health Clinic,Follow-up appt scheduled   Living arrangements for the past 2 months: No permanent address                                       Social Determinants of Health (SDOH) Interventions    Readmission Risk Interventions Readmission Risk Prevention Plan 02/04/2021  Post Dischage Appt Complete  Medication Screening Complete  Transportation Screening Complete   Quintella Baton, RN, BSN  Trauma/Neuro ICU Case Manager [phone]
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
## 2021-02-09 NOTE — TOC Transition Note (Signed)
Transition of Care [facility]) - CM/SW Discharge Note   Patient Details  Name: David Mays MRN: [id number] Date of Birth: 1996/06/20  Transition of Care [facility]) CM/SW Contact:  Glennon Mac, RN Phone Number: 02/09/2021, 2:06 PM   Clinical Narrative:  Pt medically stable for discharge home today with friends to assist.  "[name]" states she will pick pt up between 4 and 5pm.  She will call pt when she is downstairs.       Final next level of care: OP Rehab Barriers to Discharge: Barriers Resolved                       Discharge Plan and Services   Discharge Planning Services: CM Consult,Indigent Health Clinic,Follow-up appt scheduled            DME Arranged: Gilmer Mor DME Agency: [employer] Date DME Agency Contacted: 02/08/21 Time DME Agency Contacted: [phone] Representative spoke with at DME Agency: Velna Hatchet            Social Determinants of Health (SDOH) Interventions     Readmission Risk Interventions Readmission Risk Prevention Plan 02/04/2021  Post Dischage Appt Complete  Medication Screening Complete  Transportation Screening Complete   Quintella Baton, RN, BSN  Trauma/Neuro ICU Case Manager [phone]

## 2021-02-09 NOTE — Progress Notes (Signed)
Pt given all D/C education and questions answered. No printed prescriptions to give. Medications delivered to the room prior to D/C. Equipment (cane delivered to room prior to D/C. IV removed. Pt taken to car with all belongings. RN educated Mrs. David Mays about medications and follow up needs.
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
## 2021-03-18 ENCOUNTER — Inpatient Hospital Stay: Payer: Medicaid Other | Admitting: Physician Assistant

## 2021-05-05 ENCOUNTER — Ambulatory Visit: Payer: Medicaid Other | Attending: Physician Assistant | Admitting: Physician Assistant

## 2021-05-05 ENCOUNTER — Other Ambulatory Visit: Payer: Self-pay
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
## 2022-10-14 ENCOUNTER — Other Ambulatory Visit: Payer: Self-pay

## 2022-10-14 ENCOUNTER — Other Ambulatory Visit (HOSPITAL_COMMUNITY): Payer: Medicaid Other

## 2022-10-14 ENCOUNTER — Emergency Department (HOSPITAL_COMMUNITY): Payer: Medicaid Other

## 2022-10-14 ENCOUNTER — Emergency Department (HOSPITAL_COMMUNITY)
Admission: EM | Admit: 2022-10-14 | Discharge: 2022-10-14 | Disposition: A | Discharge status: Home / Self Care | Payer: Medicaid Other | Attending: Physician Assistant | Admitting: Physician Assistant

## 2022-10-14 DIAGNOSIS — S61012A Laceration without foreign body of left thumb without damage to nail, initial encounter: Secondary | ICD-10-CM | POA: Diagnosis not present

## 2022-10-14 DIAGNOSIS — W269XXA Contact with unspecified sharp object(s), initial encounter: Secondary | ICD-10-CM | POA: Diagnosis not present

## 2022-10-14 DIAGNOSIS — Z23 Encounter for immunization: Secondary | ICD-10-CM | POA: Diagnosis not present

## 2022-10-14 MED ORDER — LIDOCAINE HCL (PF) 1 % IJ SOLN
5.0000 mL | Freq: Once | INTRAMUSCULAR | Status: DC
  Filled 2022-10-14: qty 5

## 2022-10-14 MED ORDER — TETANUS-DIPHTH-ACELL PERTUSSIS 5-2.5-18.5 LF-MCG/0.5 IM SUSY
0.5000 mL | PREFILLED_SYRINGE | Freq: Once | INTRAMUSCULAR | Status: AC
  Administered 2022-10-14: 0.5 mL via INTRAMUSCULAR
  Filled 2022-10-14: qty 0.5

## 2022-10-14 MED ORDER — HYDROCODONE-ACETAMINOPHEN 5-325 MG PO TABS
1.0000 | ORAL_TABLET | Freq: Once | ORAL | Status: AC
  Administered 2022-10-14: 1 via ORAL
  Filled 2022-10-14: qty 1

## 2022-10-14 NOTE — ED Provider Notes (Signed)
[facility] EMERGENCY DEPARTMENT Provider Note   CSN: [id number] Arrival date & time: 10/14/22  2049     History {Add pertinent medical, surgical, social history, OB history to HPI:1} Chief Complaint  Patient presents with   Finger Injury    David Mays is a 26 y.o. male.  26 year old male presents to the ED with a chief complaint of left hand injury while at work.  Reports he was cutting a CABG when suddenly he sliced part of his left thumb.  He endorses pain around the area, states the bleeding was not controlled despite pressure bandage.  Last tetanus immunization is unknown.  No other complaints.  The history is provided by the patient and medical records.       Home Medications Prior to Admission medications   Medication Sig Start Date End Date Taking? Authorizing Provider  acetaminophen (TYLENOL) 325 MG tablet Take 2 tablets (650 mg total) by mouth every 6 (six) hours as needed for mild pain. 02/05/21   Meuth, Blaine Hamper, PA-C  acetaminophen (TYLENOL) 500 MG tablet Take 1 tablet (500 mg total) by mouth every 6 (six) hours as needed. 06/09/20   Fawze, Mina A, PA-C  escitalopram (LEXAPRO) 20 MG tablet Take 20 mg by mouth daily.    [provider]  gabapentin (NEURONTIN) 600 MG tablet Take 0.5 tablets (300 mg total) by mouth 3 (three) times daily. 02/05/21   Meuth, Brooke A, PA-C  gabapentin (NEURONTIN) 600 MG tablet TAKE 1/2 TABLET (300 MG TOTAL) BY MOUTH THREE TIMES DAILY. 02/05/21 02/05/22  Meuth, Brooke A, PA-C  ibuprofen (ADVIL,MOTRIN) 600 MG tablet Take 1 tablet (600 mg total) by mouth every 6 (six) hours as needed. 09/01/18   Maczis, Barth Kirks, PA-C  lidocaine (LMX) 4 % cream Apply 1 application topically 3 (three) times daily as needed. 06/09/20   Nils Flack, Mina A, PA-C  methocarbamol (ROBAXIN) 500 MG tablet Take 1 tablet (500 mg total) by mouth every 6 (six) hours as needed for muscle spasms. 02/05/21   Meuth, Brooke A, PA-C  oxyCODONE 10 MG TABS  Take 0.5-1 tablets (5-10 mg total) by mouth every 6 (six) hours as needed for moderate pain or severe pain (5mg  moderate, 10mg  severe). 02/05/21   Meuth, Brooke A, PA-C  polyethylene glycol (MIRALAX / GLYCOLAX) 17 g packet Take 17 g by mouth daily as needed for mild constipation. 02/05/21   Meuth, Blaine Hamper, PA-C      Allergies    Patient has no known allergies.    Review of Systems   Review of Systems  Constitutional:  Negative for fever.  Skin:  Positive for wound.    Physical Exam Updated Vital Signs BP 116/75   Pulse 68   Temp 98 F (36.7 C) (Oral)   Resp 18   Wt 72.6 kg   SpO2 97%   BMI 23.64 kg/m  Physical Exam Vitals and nursing note reviewed.  Musculoskeletal:     Cervical back: Normal range of motion and neck supple.  Neurological:     Mental Status: He is alert.     ED Results / Procedures / Treatments   Labs (all labs ordered are listed, but only abnormal results are displayed) Labs Reviewed - No data to display  EKG None  Radiology DG Hand Complete Left  Result Date: 10/14/2022 CLINICAL DATA:  Laceration of the first digit. EXAM: LEFT HAND - COMPLETE 3+ VIEW COMPARISON:  Left wrist radiograph dated 02/02/2021. FINDINGS: There is no acute fracture or  dislocation. Old healed fractures of the scaphoid and radial styloid status post prior pinning. The screws appear intact. Mild soft tissue swelling of the thumb. No radiopaque foreign object or soft tissue gas. Dressing noted over the thumb. IMPRESSION: 1. No acute fracture or dislocation. 2. Mild soft tissue swelling of the thumb. Electronically Signed   By: Elgie Collard M.D.   On: 10/14/2022 21:50    Procedures .Marland KitchenLaceration Repair  Date/Time: 10/14/2022 10:05 PM  Performed by: Claude Manges, PA-C Authorized by: Claude Manges, PA-C   Consent:    Consent obtained:  Verbal   Consent given by:  Parent   Risks discussed:  Infection Universal protocol:    Patient identity confirmed:  Verbally with  patient Anesthesia:    Anesthesia method:  Local infiltration   Local anesthetic:  Lidocaine 1% w/o epi Laceration details:    Location:  Finger   Finger location:  L thumb   Length (cm):  1   Depth (mm):  1 Exploration:    Hemostasis achieved with:  Direct pressure Treatment:    Area cleansed with:  Saline   Amount of cleaning:  Extensive Skin repair:    Repair method:  Sutures   Suture size:  3-0   Suture material:  Prolene   Suture technique:  Simple interrupted   Number of sutures:  3 Approximation:    Approximation:  Close Post-procedure details:    Dressing:  Bulky dressing   Procedure completion:  Tolerated well, no immediate complications   {Document cardiac monitor, telemetry assessment procedure when appropriate:1}  Medications Ordered in ED Medications  lidocaine (PF) (XYLOCAINE) 1 % injection 5 mL (has no administration in time range)  HYDROcodone-acetaminophen (NORCO/VICODIN) 5-325 MG per tablet 1 tablet (1 tablet Oral Given 10/14/22 2104)    ED Course/ Medical Decision Making/ A&P                           Medical Decision Making Amount and/or Complexity of Data Reviewed Radiology: ordered.  Risk Prescription drug management.   ***  {Document critical care time when appropriate:1} {Document review of labs and clinical decision tools ie heart score, Chads2Vasc2 etc:1}  {Document your independent review of radiology images, and any outside records:1} {Document your discussion with family members, caretakers, and with consultants:1} {Document social determinants of health affecting pt's care:1} {Document your decision making why or why not admission, treatments were needed:1} Final Clinical Impression(s) / ED Diagnoses Final diagnoses:  None    Rx / DC Orders ED Discharge Orders     None

## 2022-10-14 NOTE — Discharge Instructions (Addendum)
You had 3 stitches placed to your left thumb, you will need to have these removed within the next 7 to 10 days.  If you experience any redness, fever, worsening symptoms you will need to return to emergency department.

## 2022-10-14 NOTE — ED Triage Notes (Signed)
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 Bleeding through pressure drsg on ED arrival. VS en route: 130/80 86 18 98% RA CBG 89

## 2022-10-14 NOTE — ED Notes (Signed)
Patient verbalizes understanding of discharge instructions. Opportunity for questioning and answers were provided. Armband removed by staff, pt discharged from ED. Ambulated out to lobby  

## 2022-10-26 ENCOUNTER — Encounter (HOSPITAL_COMMUNITY): Payer: Self-pay | Admitting: Emergency Medicine

## 2022-10-26 ENCOUNTER — Emergency Department (HOSPITAL_COMMUNITY)
Admission: EM | Admit: 2022-10-26 | Discharge: 2022-10-26 | Disposition: A | Discharge status: Home / Self Care | Payer: Medicaid Other | Attending: Emergency Medicine | Admitting: Emergency Medicine

## 2022-10-26 DIAGNOSIS — Z4802 Encounter for removal of sutures: Secondary | ICD-10-CM | POA: Diagnosis not present

## 2022-10-26 NOTE — ED Provider Notes (Signed)
[facility] EMERGENCY DEPARTMENT Provider Note   CSN: [id number] Arrival date & time: 10/26/22  0935     History  Chief Complaint  Patient presents with   Suture / Staple Removal    David Mays is a 26 y.o. male.  HPI Presents about 10 days after sustaining a wound to his left thumb that required sutures now with request for removal.  Cannot specify why he waited beyond his recommended duration of time to have suture removal, but beyond mild pruritus, has no focal complaints in the area, notes that he has preserved sensation, inability to move his thumb in all dimensions.    Home Medications Prior to Admission medications   Medication Sig Start Date End Date Taking? Authorizing Provider  acetaminophen (TYLENOL) 325 MG tablet Take 2 tablets (650 mg total) by mouth every 6 (six) hours as needed for mild pain. 02/05/21   Meuth, Lina Sar, PA-C  acetaminophen (TYLENOL) 500 MG tablet Take 1 tablet (500 mg total) by mouth every 6 (six) hours as needed. 06/09/20   Fawze, Mina A, PA-C  escitalopram (LEXAPRO) 20 MG tablet Take 20 mg by mouth daily.    [provider]  gabapentin (NEURONTIN) 600 MG tablet Take 0.5 tablets (300 mg total) by mouth 3 (three) times daily. 02/05/21   Meuth, Brooke A, PA-C  gabapentin (NEURONTIN) 600 MG tablet TAKE 1/2 TABLET (300 MG TOTAL) BY MOUTH THREE TIMES DAILY. 02/05/21 02/05/22  Meuth, Brooke A, PA-C  ibuprofen (ADVIL,MOTRIN) 600 MG tablet Take 1 tablet (600 mg total) by mouth every 6 (six) hours as needed. 09/01/18   Maczis, Elmer Sow, PA-C  lidocaine (LMX) 4 % cream Apply 1 application topically 3 (three) times daily as needed. 06/09/20   Luevenia Maxin, Mina A, PA-C  methocarbamol (ROBAXIN) 500 MG tablet Take 1 tablet (500 mg total) by mouth every 6 (six) hours as needed for muscle spasms. 02/05/21   Meuth, Brooke A, PA-C  oxyCODONE 10 MG TABS Take 0.5-1 tablets (5-10 mg total) by mouth every 6 (six) hours as needed for moderate pain or  severe pain (5mg  moderate, 10mg  severe). 02/05/21   Meuth, Brooke A, PA-C  polyethylene glycol (MIRALAX / GLYCOLAX) 17 g packet Take 17 g by mouth daily as needed for mild constipation. 02/05/21   Meuth, 02/07/21, PA-C      Allergies    Patient has no known allergies.    Review of Systems   Review of Systems  Skin:        Per HPI    Physical Exam Updated Vital Signs BP 111/79   Pulse 78   Temp 98 F (36.7 C) (Oral)   Resp 18   SpO2 99%  Physical Exam Vitals and nursing note reviewed.  Constitutional:      General: He is not in acute distress.    Appearance: He is well-developed.  HENT:     Head: Normocephalic and atraumatic.  Eyes:     Conjunctiva/sclera: Conjunctivae normal.  Pulmonary:     Effort: Pulmonary effort is normal. No respiratory distress.     Breath sounds: No stridor.  Abdominal:     General: There is no distension.  Musculoskeletal:     Comments: Left thumb 3 sutures across a medial laceration on the distal interphalangeal joint.  Skin is otherwise intact, no substantial erythema, no drainage, no bleeding  Skin:    General: Skin is warm and dry.  Neurological:     Mental Status: He is alert and  oriented to person, place, and time.     ED Results / Procedures / Treatments   Labs (all labs ordered are listed, but only abnormal results are displayed) Labs Reviewed - No data to display  EKG None  Radiology No results found.  Procedures .Suture Removal  Date/Time: 10/26/2022 9:55 AM  Performed by: Gerhard Munch, MD Authorized by: Gerhard Munch, MD   Consent:    Consent obtained:  Verbal   Consent given by:  Patient   Risks, benefits, and alternatives were discussed: yes     Risks discussed:  Bleeding, pain and wound separation   Alternatives discussed:  No treatment Universal protocol:    Immediately prior to procedure, a time out was called: yes     Patient identity confirmed:  Verbally with patient Location:    Location:  Upper  extremity   Upper extremity location:  Hand   Hand location:  L thumb Procedure details:    Wound appearance:  No signs of infection   Number of sutures removed:  3 Post-procedure details:    Post-removal:  Dressing applied   Procedure completion:  Tolerated     Medications Ordered in ED Medications - No data to display  ED Course/ Medical Decision Making/ A&P                           Medical Decision Making  Well-appearing adult male presents due to need for suture removal.  No evidence for infection, no loss of range of motion, no actual complaints by the patient beyond mild itchiness. Final Clinical Impression(s) / ED Diagnoses Final diagnoses:  Visit for suture removal    Rx / DC Orders ED Discharge Orders     None         Gerhard Munch, MD 10/26/22 [phone]

## 2022-10-26 NOTE — ED Triage Notes (Signed)
Pt here for suture removal to left thumb after 10 days.

## 2023-04-05 ENCOUNTER — Encounter (HOSPITAL_COMMUNITY): Payer: Self-pay | Admitting: Pharmacy Technician

## 2023-04-05 ENCOUNTER — Emergency Department (HOSPITAL_COMMUNITY)
Admission: EM | Admit: 2023-04-05 | Discharge: 2023-04-05 | Disposition: A | Discharge status: Home / Self Care | Payer: Medicaid Other | Attending: Emergency Medicine | Admitting: Emergency Medicine

## 2023-04-05 ENCOUNTER — Emergency Department (HOSPITAL_COMMUNITY): Payer: Medicaid Other

## 2023-04-05 ENCOUNTER — Other Ambulatory Visit: Payer: Self-pay

## 2023-04-05 DIAGNOSIS — R079 Chest pain, unspecified: Secondary | ICD-10-CM | POA: Insufficient documentation

## 2023-04-05 DIAGNOSIS — R1013 Epigastric pain: Secondary | ICD-10-CM | POA: Insufficient documentation

## 2023-04-05 LAB — COMPREHENSIVE METABOLIC PANEL
ALT: 27 U/L (ref 0–44)
AST: 20 U/L (ref 15–41)
Albumin: 4.1 g/dL (ref 3.5–5.0)
Alkaline Phosphatase: 50 U/L (ref 38–126)
Anion gap: 8 (ref 5–15)
BUN: 10 mg/dL (ref 6–20)
CO2: 25 mmol/L (ref 22–32)
Calcium: 9.4 mg/dL (ref 8.9–10.3)
Chloride: 102 mmol/L (ref 98–111)
Creatinine, Ser: 0.59 mg/dL — ABNORMAL LOW (ref 0.61–1.24)
GFR, Estimated: >60 mL/min (ref >60)
Glucose, Bld: 86 mg/dL (ref 70–99)
Potassium: 3.8 mmol/L (ref 3.5–5.1)
Sodium: 135 mmol/L (ref 135–145)
Total Bilirubin: 0.7 mg/dL (ref 0.3–1.2)
Total Protein: 7.1 g/dL (ref 6.5–8.1)

## 2023-04-05 LAB — CBC
HCT: 40.5 % (ref 39.0–52.0)
Hemoglobin: 13.3 g/dL (ref 13.0–17.0)
MCH: 29.6 pg (ref 26.0–34.0)
MCHC: 32.8 g/dL (ref 30.0–36.0)
MCV: 90.2 fL (ref 80.0–100.0)
Platelets: 284 10*3/uL (ref 150–400)
RBC: 4.49 MIL/uL (ref 4.22–5.81)
RDW: 13.7 % (ref 11.5–15.5)
WBC: 8 10*3/uL (ref 4.0–10.5)
nRBC: 0 % (ref 0.0–0.2)

## 2023-04-05 LAB — URINALYSIS, ROUTINE W REFLEX MICROSCOPIC
Bacteria, UA: NONE SEEN
Bilirubin Urine: NEGATIVE
Glucose, UA: NEGATIVE mg/dL
Ketones, ur: NEGATIVE mg/dL
Nitrite: NEGATIVE
Protein, ur: NEGATIVE mg/dL
Specific Gravity, Urine: 1.01 (ref 1.005–1.030)
pH: 6 (ref 5.0–8.0)

## 2023-04-05 LAB — LIPASE, BLOOD: Lipase: 27 U/L (ref 11–51)

## 2023-04-05 LAB — TROPONIN I (HIGH SENSITIVITY)
Troponin I (High Sensitivity): 2 ng/L (ref <18)
Troponin I (High Sensitivity): 3 ng/L (ref <18)

## 2023-04-05 LAB — CBG MONITORING, ED: Glucose-Capillary: 76 mg/dL (ref 70–99)

## 2023-04-05 MED ORDER — ACETAMINOPHEN 500 MG PO TABS
1000.0000 mg | ORAL_TABLET | Freq: Once | ORAL | Status: AC
  Administered 2023-04-05: 1000 mg via ORAL
  Filled 2023-04-05: qty 2

## 2023-04-05 MED ORDER — ASPIRIN 81 MG PO CHEW
324.0000 mg | CHEWABLE_TABLET | Freq: Once | ORAL | Status: AC
  Administered 2023-04-05: 324 mg via ORAL
  Filled 2023-04-05: qty 4

## 2023-04-05 MED ORDER — FAMOTIDINE 20 MG PO TABS
20.0000 mg | ORAL_TABLET | Freq: Once | ORAL | Status: AC
  Administered 2023-04-05: 20 mg via ORAL
  Filled 2023-04-05: qty 1

## 2023-04-05 NOTE — ED Provider Notes (Signed)
[place] EMERGENCY DEPARTMENT AT [facility] Provider Note   CSN: [id number] Arrival date & time: 04/05/23  1311     History Chief Complaint  Patient presents with   Abdominal Pain    HPI David Mays is a 27 y.o. male presenting for chief complaint of epigastric radiating into left-sided chest pain. He is a 27 year old male with a minimal medical history.  Is ambulatory tolerating p.o. intake on arrival.  Minimal medical history.  States his symptoms have grossly improved at this time.  Worse with palpation of his left chest.   Patient's recorded medical, surgical, social, medication list and allergies were reviewed in the Snapshot window as part of the initial history.   Review of Systems   Review of Systems  Constitutional:  Negative for chills and fever.  HENT:  Negative for ear pain and sore throat.   Eyes:  Negative for pain and visual disturbance.  Respiratory:  Negative for cough and shortness of breath.   Cardiovascular:  Positive for chest pain. Negative for palpitations.  Gastrointestinal:  Negative for abdominal pain and vomiting.  Genitourinary:  Negative for dysuria and hematuria.  Musculoskeletal:  Negative for arthralgias and back pain.  Skin:  Negative for color change and rash.  Neurological:  Negative for seizures and syncope.  All other systems reviewed and are negative.   Physical Exam Updated Vital Signs BP 105/75   Pulse 72   Temp 97.8 F (36.6 C) (Oral)   Resp 13   SpO2 100%  Physical Exam   ED Course/ Medical Decision Making/ A&P    Procedures Procedures   Medications Ordered in ED Medications  aspirin chewable tablet 324 mg (324 mg Oral Given 04/05/23 1328)  acetaminophen (TYLENOL) tablet 1,000 mg (1,000 mg Oral Given 04/05/23 1626)  famotidine (PEPCID) tablet 20 mg (20 mg Oral Given 04/05/23 1626)   Medical Decision Making: David Mays is a 27 y.o. male who presented to the ED today with chest pain, detailed  above.  Based on patient's comorbidities, patient has a heart score of 2.    Patient placed on continuous vitals and telemetry monitoring while in ED which was reviewed periodically.  Complete initial physical exam performed, notably the patient was HDS in NAD.   Reviewed and confirmed nursing documentation for past medical history, family history, social history.    Initial Assessment: With the patient's presentation of left-sided chest pain, most likely diagnosis is musculoskeletal chest pain versus GERD, although ACS remains on the differential. Other diagnoses were considered including (but not limited to) pulmonary embolism, community-acquired pneumonia, aortic dissection, pneumothorax, underlying bony abnormality, anemia. These are considered less likely due to history of present illness and physical exam findings.    In particular, concerning pulmonary embolism: Patient is PERC negative and the they deny malignancy, recent surgery, history of DVT, or calf tenderness leading to a low risk Wells score. Aortic Dissection also reconsidered but seems less likely based on the location, quality, onset, and severity of symptoms in this case. Patient has a lack of serious comorbidities for this condition including a lack of HTN or smoking. Patient also has a lack of underlying history of AD or TAA.  This is most consistent with an acute life/limb threatening illness complicated by underlying chronic conditions.   Initial Plan: Evaluate for ACS with delta troponin and EKG evaluated as below  Evaluate for dissection, bony abnormality, or pneumonia with chest x-ray and screening laboratory evaluation including CBC, BMP  Further evaluation for pulmonary  embolism not indicated at this time based on patient's PERC and Wells score.  Further evaluation for Thoracic Aortic Dissection not indicated at this time based on patient's clinical history and PE findings.   Initial Study Results: EKG was reviewed  independently. Rate, rhythm, axis, intervals all examined and without medically relevant abnormality. ST segments without concerns for elevations.  Patient does have substantial early repolarization on his EKG which has been demonstrated in prior.  Laboratory  Delta troponin demonstrated no acute pathology  CBC and BMP without obvious metabolic or inflammatory abnormalities requiring further evaluation   Radiology  DG Chest 1 View  Result Date: 04/05/2023 CLINICAL DATA:  Central chest pain with shortness of breath over the past few weeks. EXAM: CHEST  1 VIEW COMPARISON:  Chest radiographs 02/02/2021 and 06/09/2020 FINDINGS: Cardiac silhouette and mediastinal contours are within normal limits. The lungs are clear. No pleural effusion or pneumothorax. No acute skeletal abnormality. Partial visualization of lower cervical spine ACDF hardware. IMPRESSION: No active disease. Electronically Signed   By: Neita Garnet M.D.   On: 04/05/2023 13:57    Final Assessment and Plan: Reassessed after 6 hours of observation emergency room.  Chest pain remains resolved.  Given relatively well appearance resolution of symptoms, patient stable for outpatient care and management. Disposition:  I have considered need for hospitalization, however, considering all of the above, I believe this patient is stable for discharge at this time.  Patient/family educated about specific return precautions for given chief complaint and symptoms.  Patient/family educated about follow-up with PCP.     Patient/family expressed understanding of return precautions and need for follow-up. Patient spoken to regarding all imaging and laboratory results and appropriate follow up for these results. All education provided in verbal form with additional information in written form. Time was allowed for answering of patient questions. Patient discharged.    Emergency Department Medication Summary:   Medications  aspirin chewable tablet 324  mg (324 mg Oral Given 04/05/23 1328)  acetaminophen (TYLENOL) tablet 1,000 mg (1,000 mg Oral Given 04/05/23 1626)  famotidine (PEPCID) tablet 20 mg (20 mg Oral Given 04/05/23 1626)                 Clinical Impression:  1. Chest pain, unspecified type      Discharge   Final Clinical Impression(s) / ED Diagnoses Final diagnoses:  Chest pain, unspecified type    Rx / DC Orders ED Discharge Orders     None         Glyn Ade, MD 04/05/23 1924

## 2023-04-05 NOTE — ED Triage Notes (Signed)
Pt bib ptar from church with central epigastric pain. Hx anxiety. Pain worse with palpation, movement. PTAR reports pt with dry heaving. VSS.

## 2023-04-05 NOTE — ED Provider Triage Note (Signed)
Emergency Medicine Provider Triage Evaluation Note  David Mays , a 27 y.o. male  was evaluated in triage.  Pt complains of epigastric pain, radiating towards his sternum.  Pain has been episodic this week, but is now present/persistent.  Review of Systems  Positive: Chest pain Negative: Vomiting  Physical Exam  BP 109/72 (BP Location: Right Arm)   Pulse 69   Temp 98.2 F (36.8 C) (Oral)   Resp 18   SpO2 100%  Gen:   Awake, no distress speaking clearly Resp:  Normal effort no increased work of breathing MSK:   Moves extremities without difficulty no deformity, though he does have stigmata of prior left wrist repair Other:  Psych calm  Medical Decision Making  Medically screening exam initiated at 1:18 PM.  Appropriate orders placed.  David Mays was informed that the remainder of the evaluation will be completed by another provider, this initial triage assessment does not replace that evaluation, and the importance of remaining in the ED until their evaluation is complete.    Gerhard Munch, MD 04/05/23 1318

## 2023-07-17 ENCOUNTER — Emergency Department (HOSPITAL_COMMUNITY)
Admission: EM | Admit: 2023-07-17 | Discharge: 2023-07-17 | Disposition: A | Discharge status: Home / Self Care | Payer: No Typology Code available for payment source | Attending: Emergency Medicine | Admitting: Emergency Medicine

## 2023-07-17 ENCOUNTER — Emergency Department (HOSPITAL_COMMUNITY): Payer: No Typology Code available for payment source

## 2023-07-17 ENCOUNTER — Encounter (HOSPITAL_COMMUNITY): Payer: Self-pay

## 2023-07-17 ENCOUNTER — Other Ambulatory Visit: Payer: Self-pay

## 2023-07-17 DIAGNOSIS — K0889 Other specified disorders of teeth and supporting structures: Secondary | ICD-10-CM | POA: Diagnosis present

## 2023-07-17 DIAGNOSIS — K047 Periapical abscess without sinus: Secondary | ICD-10-CM | POA: Insufficient documentation

## 2023-07-17 DIAGNOSIS — D72829 Elevated white blood cell count, unspecified: Secondary | ICD-10-CM | POA: Insufficient documentation

## 2023-07-17 DIAGNOSIS — R0789 Other chest pain: Secondary | ICD-10-CM | POA: Insufficient documentation

## 2023-07-17 LAB — BASIC METABOLIC PANEL
Anion gap: 11 (ref 5–15)
BUN: 9 mg/dL (ref 6–20)
CO2: 25 mmol/L (ref 22–32)
Calcium: 9.4 mg/dL (ref 8.9–10.3)
Chloride: 101 mmol/L (ref 98–111)
Creatinine, Ser: 0.78 mg/dL (ref 0.61–1.24)
GFR, Estimated: >60 mL/min (ref >60)
Glucose, Bld: 82 mg/dL (ref 70–99)
Potassium: 3.7 mmol/L (ref 3.5–5.1)
Sodium: 137 mmol/L (ref 135–145)

## 2023-07-17 LAB — CBC
HCT: 41.1 % (ref 39.0–52.0)
Hemoglobin: 13.6 g/dL (ref 13.0–17.0)
MCH: 29.3 pg (ref 26.0–34.0)
MCHC: 33.1 g/dL (ref 30.0–36.0)
MCV: 88.6 fL (ref 80.0–100.0)
Platelets: 238 10*3/uL (ref 150–400)
RBC: 4.64 MIL/uL (ref 4.22–5.81)
RDW: 13.2 % (ref 11.5–15.5)
WBC: 11.6 10*3/uL — ABNORMAL HIGH (ref 4.0–10.5)
nRBC: 0 % (ref 0.0–0.2)

## 2023-07-17 LAB — TROPONIN I (HIGH SENSITIVITY)
Troponin I (High Sensitivity): 3 ng/L (ref <18)
Troponin I (High Sensitivity): 3 ng/L (ref <18)

## 2023-07-17 MED ORDER — AMOXICILLIN-POT CLAVULANATE 875-125 MG PO TABS
1.0000 | ORAL_TABLET | Freq: Once | ORAL | Status: AC
  Administered 2023-07-17: 1 via ORAL
  Filled 2023-07-17: qty 1

## 2023-07-17 MED ORDER — KETOROLAC TROMETHAMINE 15 MG/ML IJ SOLN
30.0000 mg | Freq: Once | INTRAMUSCULAR | Status: DC
  Filled 2023-07-17: qty 2

## 2023-07-17 MED ORDER — KETOROLAC TROMETHAMINE 15 MG/ML IJ SOLN: 15.0000 mg | Freq: Once | INTRAMUSCULAR | Status: DC

## 2023-07-17 MED ORDER — KETOROLAC TROMETHAMINE 15 MG/ML IJ SOLN
15.0000 mg | Freq: Once | INTRAMUSCULAR | Status: AC
  Administered 2023-07-17: 15 mg via INTRAVENOUS

## 2023-07-17 MED ORDER — FAMOTIDINE 20 MG PO TABS
20.0000 mg | ORAL_TABLET | Freq: Once | ORAL | Status: AC
  Administered 2023-07-17: 20 mg via ORAL
  Filled 2023-07-17: qty 1

## 2023-07-17 MED ORDER — ACETAMINOPHEN 500 MG PO TABS
1000.0000 mg | ORAL_TABLET | Freq: Once | ORAL | Status: AC
  Administered 2023-07-17: 1000 mg via ORAL
  Filled 2023-07-17: qty 2

## 2023-07-17 MED ORDER — AMOXICILLIN-POT CLAVULANATE 875-125 MG PO TABS: 1.0000 | ORAL_TABLET | Freq: Two times a day (BID) | ORAL | 0 refills | Status: DC

## 2023-07-17 NOTE — Discharge Instructions (Addendum)
Evaluation today revealed that you likely have a dental infection. I am starting on Augmentin which is an antibiotic please take the entire course and follow-up with a dentist.  If you have inability to swallow, trouble breathing, swelling of your tongue or soft palate or any other concerning symptoms please return emergency department further evaluation.

## 2023-07-17 NOTE — ED Triage Notes (Signed)
Pt reports right upper dental pain and swelling since yesterday. Pt unable to open his mouth much due to the pain and swelling. Airway intact. Pt also states the pain has his chest hurting.

## 2023-07-17 NOTE — ED Provider Notes (Signed)
[place] EMERGENCY DEPARTMENT AT [facility] Provider Note   CSN: [id number] Arrival date & time: 07/17/23  1128     History  Chief Complaint  Patient presents with   Dental Pain   HPI David Mays is a 27 y.o. male presenting for dental pain and chest pain.  Dental pain started yesterday.  In the right lower posterior teeth.  Also endorses associated swelling in that area.  States he believes there is a "hole" in his wisdom tooth that is causing his symptoms.  Endorses pain with eating and drinking.  Denies fever and chills.  Also states he started having some pain in his left chest as well.  This started on his way to the ED.  Pain feels like a "soreness".  It is nonradiating.  It is worse with movement of the upper body and left arm.  Endorse associated shortness of breath.   Dental Pain      Home Medications Prior to Admission medications   Medication Sig Start Date End Date Taking? Authorizing Provider  acetaminophen (TYLENOL) 325 MG tablet Take 2 tablets (650 mg total) by mouth every 6 (six) hours as needed for mild pain. 02/05/21   Meuth, Lina Sar, PA-C  acetaminophen (TYLENOL) 500 MG tablet Take 1 tablet (500 mg total) by mouth every 6 (six) hours as needed. 06/09/20   Fawze, Mina A, PA-C  escitalopram (LEXAPRO) 20 MG tablet Take 20 mg by mouth daily.    [provider]  gabapentin (NEURONTIN) 600 MG tablet Take 0.5 tablets (300 mg total) by mouth 3 (three) times daily. 02/05/21   Meuth, Brooke A, PA-C  gabapentin (NEURONTIN) 600 MG tablet TAKE 1/2 TABLET (300 MG TOTAL) BY MOUTH THREE TIMES DAILY. 02/05/21 02/05/22  Meuth, Brooke A, PA-C  ibuprofen (ADVIL,MOTRIN) 600 MG tablet Take 1 tablet (600 mg total) by mouth every 6 (six) hours as needed. 09/01/18   Maczis, Elmer Sow, PA-C  lidocaine (LMX) 4 % cream Apply 1 application topically 3 (three) times daily as needed. 06/09/20   Luevenia Maxin, Mina A, PA-C  methocarbamol (ROBAXIN) 500 MG tablet Take 1 tablet (500 mg  total) by mouth every 6 (six) hours as needed for muscle spasms. 02/05/21   Meuth, Brooke A, PA-C  oxyCODONE 10 MG TABS Take 0.5-1 tablets (5-10 mg total) by mouth every 6 (six) hours as needed for moderate pain or severe pain (5mg  moderate, 10mg  severe). 02/05/21   Meuth, Brooke A, PA-C  polyethylene glycol (MIRALAX / GLYCOLAX) 17 g packet Take 17 g by mouth daily as needed for mild constipation. 02/05/21   Meuth, Lina Sar, PA-C      Allergies    Patient has no known allergies.    Review of Systems   See HPI for pertinent positives  Physical Exam Updated Vital Signs BP 125/77   Pulse (!) 58   Temp 98.8 F (37.1 C) (Oral)   Resp 12   Ht 5\' 9"  (1.753 m)   Wt 63.5 kg   SpO2 100%   BMI 20.67 kg/m  Physical Exam Vitals and nursing note reviewed.  HENT:     Head: Normocephalic and atraumatic.     Mouth/Throat:     Mouth: Mucous membranes are moist.     Pharynx: Oropharynx is clear. Uvula midline. No pharyngeal swelling, oropharyngeal exudate, posterior oropharyngeal erythema, uvula swelling or postnasal drip.   Eyes:     General:        Right eye: No discharge.  Left eye: No discharge.     Conjunctiva/sclera: Conjunctivae normal.  Cardiovascular:     Rate and Rhythm: Normal rate and regular rhythm.     Pulses: Normal pulses.          Radial pulses are 2+ on the right side and 2+ on the left side.     Heart sounds: Normal heart sounds.  Pulmonary:     Effort: Pulmonary effort is normal.     Breath sounds: Normal breath sounds.  Chest:    Abdominal:     General: Abdomen is flat.     Palpations: Abdomen is soft.  Skin:    General: Skin is warm and dry.  Neurological:     General: No focal deficit present.  Psychiatric:        Mood and Affect: Mood normal.     ED Results / Procedures / Treatments   Labs (all labs ordered are listed, but only abnormal results are displayed) Labs Reviewed  CBC - Abnormal; Notable for the following components:      Result Value    WBC 11.6 (*)    All other components within normal limits  BASIC METABOLIC PANEL  TROPONIN I (HIGH SENSITIVITY)  TROPONIN I (HIGH SENSITIVITY)    EKG EKG Interpretation Date/Time:  Monday July 17 2023 12:17:42 EDT Ventricular Rate:  64 PR Interval:  146 QRS Duration:  92 QT Interval:  384 QTC Calculation: 397 R Axis:   90  Text Interpretation: Sinus arrhythmia Borderline right axis deviation RSR' in V1 or V2, probably normal variant LVH by voltage ST elev, probable normal early repol pattern similar to prior EKG Confirmed by Rolan Bucco [phone]) on 07/17/2023 2:05:38 PM  Radiology DG Chest Port 1 View  Result Date: 07/17/2023 CLINICAL DATA:  Chest pain. EXAM: PORTABLE CHEST 1 VIEW COMPARISON:  Chest x-ray May 22, 24. FINDINGS: The heart size and mediastinal contours are within normal limits. Both lungs are clear. No visible pleural effusions or pneumothorax. No acute osseous abnormality. Partially imaged ACDF. IMPRESSION: No active disease. Electronically Signed   By: Feliberto Harts M.D.   On: 07/17/2023 12:31    Procedures Procedures    Medications Ordered in ED Medications  amoxicillin-clavulanate (AUGMENTIN) 875-125 MG per tablet 1 tablet (1 tablet Oral Given 07/17/23 1247)  acetaminophen (TYLENOL) tablet 1,000 mg (1,000 mg Oral Given 07/17/23 1247)  ketorolac (TORADOL) 15 MG/ML injection 15 mg (15 mg Intravenous Given 07/17/23 1247)  famotidine (PEPCID) tablet 20 mg (20 mg Oral Given 07/17/23 1341)    ED Course/ Medical Decision Making/ A&P             HEART Score: 0                    Medical Decision Making Amount and/or Complexity of Data Reviewed Labs: ordered. Radiology: ordered.  Risk OTC drugs. Prescription drug management.   Initial Impression and Ddx 27 year old well-appearing male presenting for dental pain and chest pain.  DDx for his dental pain includes dental abscess, Ludwig angina, dental infection, dental fracture.  DDx for chest pain includes  ACS, PE, pneumonia, pneumothorax, CHF exacerbation, aortic dissection. Patient PMH that increases complexity of ED encounter:  none  Interpretation of Diagnostics - I independent reviewed and interpreted the labs as followed: leukocytosis  - I independently visualized the following imaging with scope of interpretation limited to determining acute life threatening conditions related to emergency care: CXR, which revealed no acute findings  - I personally reviewed and  interpreted EKG which showed sinus arrhythmia with borderline ST elevation which could be early repol, similar to prior EKG  Patient Reassessment and Ultimate Disposition/Management Overall patient remained clinically well.  Chest pain improved throughout encounter.  EKG was somewhat concerning but similar to prior, coupled with age, lack of risk factors and heart score and improving chest pain, thought ACS was unlikely.  PE also unlikely given PERC negative.  Chest pain likely of MSK etiology possible costochondritis or precordial catch.  As for his dental pain, workup suggestive of dental infection.  Doubt Ludwig's angina.  Started him on Augmentin and advised to follow-up with dentist.  Vitals remained stable throughout encounter.  Discussed pertinent return precautions.  Discharged home in good condition.  Patient management required discussion with the following services or consulting groups:  None  Complexity of Problems Addressed Acute complicated illness or Injury  Additional Data Reviewed and Analyzed Further history obtained from: Past medical history and medications listed in the EMR, Prior ED visit notes, and Recent discharge summary  Patient Encounter Risk Assessment Prescriptions         Final Clinical Impression(s) / ED Diagnoses Final diagnoses:  Dental infection  Atypical chest pain    Rx / DC Orders ED Discharge Orders     None         Gareth Eagle, PA-C 07/17/23 1520    Rolan Bucco, MD 07/17/23 1545

## 2023-10-15 ENCOUNTER — Emergency Department (HOSPITAL_COMMUNITY)
Admission: EM | Admit: 2023-10-15 | Discharge: 2023-10-15 | Disposition: A | Discharge status: Home / Self Care | Payer: Medicaid Other | Attending: Emergency Medicine | Admitting: Emergency Medicine

## 2023-10-15 ENCOUNTER — Other Ambulatory Visit: Payer: Self-pay

## 2023-10-15 ENCOUNTER — Emergency Department (HOSPITAL_COMMUNITY): Payer: Medicaid Other

## 2023-10-15 ENCOUNTER — Encounter (HOSPITAL_COMMUNITY): Payer: Self-pay | Admitting: Emergency Medicine

## 2023-10-15 DIAGNOSIS — W19XXXA Unspecified fall, initial encounter: Secondary | ICD-10-CM | POA: Insufficient documentation

## 2023-10-15 DIAGNOSIS — M546 Pain in thoracic spine: Secondary | ICD-10-CM | POA: Insufficient documentation

## 2023-10-15 DIAGNOSIS — S161XXA Strain of muscle, fascia and tendon at neck level, initial encounter: Secondary | ICD-10-CM | POA: Diagnosis not present

## 2023-10-15 DIAGNOSIS — S199XXA Unspecified injury of neck, initial encounter: Secondary | ICD-10-CM | POA: Diagnosis present

## 2023-10-15 MED ORDER — IBUPROFEN 800 MG PO TABS
800.0000 mg | ORAL_TABLET | Freq: Once | ORAL | Status: AC
  Administered 2023-10-15: 800 mg via ORAL
  Filled 2023-10-15: qty 1

## 2023-10-15 MED ORDER — METHOCARBAMOL 500 MG PO TABS
500.0000 mg | ORAL_TABLET | Freq: Once | ORAL | Status: AC
  Administered 2023-10-15: 500 mg via ORAL
  Filled 2023-10-15: qty 1

## 2023-10-15 NOTE — ED Notes (Signed)
Patient returned from radiology
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
## 2023-10-15 NOTE — ED Provider Notes (Signed)
[place] EMERGENCY DEPARTMENT AT [facility] Provider Note   CSN: [id number] Arrival date & time: 10/15/23  0532     History  Chief Complaint  Patient presents with   David Mays is a 27 y.o. male.  Presents to the emergency department for evaluation after a fall.  He reports that he was walking up an incline and lost his balance, falling.  Patient complaining of neck and upper back pain.       Home Medications Prior to Admission medications   Medication Sig Start Date End Date Taking? Authorizing Provider  acetaminophen (TYLENOL) 325 MG tablet Take 2 tablets (650 mg total) by mouth every 6 (six) hours as needed for mild pain. 02/05/21   Meuth, Lina Sar, PA-C  acetaminophen (TYLENOL) 500 MG tablet Take 1 tablet (500 mg total) by mouth every 6 (six) hours as needed. 06/09/20   Luevenia Maxin, Mina A, PA-C  amoxicillin-clavulanate (AUGMENTIN) 875-125 MG tablet Take 1 tablet by mouth every 12 (twelve) hours. 07/17/23   Gareth Eagle, PA-C  escitalopram (LEXAPRO) 20 MG tablet Take 20 mg by mouth daily.    [provider]  gabapentin (NEURONTIN) 600 MG tablet Take 0.5 tablets (300 mg total) by mouth 3 (three) times daily. 02/05/21   Meuth, Brooke A, PA-C  gabapentin (NEURONTIN) 600 MG tablet TAKE 1/2 TABLET (300 MG TOTAL) BY MOUTH THREE TIMES DAILY. 02/05/21 02/05/22  Meuth, Brooke A, PA-C  ibuprofen (ADVIL,MOTRIN) 600 MG tablet Take 1 tablet (600 mg total) by mouth every 6 (six) hours as needed. 09/01/18   Maczis, Elmer Sow, PA-C  lidocaine (LMX) 4 % cream Apply 1 application topically 3 (three) times daily as needed. 06/09/20   Luevenia Maxin, Mina A, PA-C  methocarbamol (ROBAXIN) 500 MG tablet Take 1 tablet (500 mg total) by mouth every 6 (six) hours as needed for muscle spasms. 02/05/21   Meuth, Brooke A, PA-C  oxyCODONE 10 MG TABS Take 0.5-1 tablets (5-10 mg total) by mouth every 6 (six) hours as needed for moderate pain or severe pain (5mg  moderate, 10mg  severe). 02/05/21    Meuth, Brooke A, PA-C  polyethylene glycol (MIRALAX / GLYCOLAX) 17 g packet Take 17 g by mouth daily as needed for mild constipation. 02/05/21   Meuth, Lina Sar, PA-C      Allergies    Patient has no known allergies.    Review of Systems   Review of Systems  Physical Exam Updated Vital Signs BP 118/75   Pulse 72   Temp 97.7 F (36.5 C) (Oral)   Resp 20   Ht 5\' 9"  (1.753 m)   Wt 63.5 kg   SpO2 98%   BMI 20.67 kg/m  Physical Exam Vitals and nursing note reviewed.  Constitutional:      General: He is not in acute distress.    Appearance: He is well-developed.  HENT:     Head: Normocephalic and atraumatic.     Mouth/Throat:     Mouth: Mucous membranes are moist.  Eyes:     General: Vision grossly intact. Gaze aligned appropriately.     Extraocular Movements: Extraocular movements intact.     Conjunctiva/sclera: Conjunctivae normal.  Cardiovascular:     Rate and Rhythm: Normal rate and regular rhythm.     Pulses: Normal pulses.     Heart sounds: Normal heart sounds, S1 normal and S2 normal. No murmur heard.    No friction rub. No gallop.  Pulmonary:     Effort: Pulmonary  effort is normal. No respiratory distress.     Breath sounds: Normal breath sounds.  Abdominal:     Palpations: Abdomen is soft.     Tenderness: There is no abdominal tenderness. There is no guarding or rebound.     Hernia: No hernia is present.  Musculoskeletal:        General: No swelling.     Cervical back: Full passive range of motion without pain, normal range of motion and neck supple. Spinous process tenderness and muscular tenderness present. No pain with movement. Normal range of motion.     Thoracic back: Tenderness present.     Right lower leg: No edema.     Left lower leg: No edema.  Skin:    General: Skin is warm and dry.     Capillary Refill: Capillary refill takes less than 2 seconds.     Findings: No ecchymosis, erythema, lesion or wound.  Neurological:     Mental Status: He is  alert and oriented to person, place, and time.     GCS: GCS eye subscore is 4. GCS verbal subscore is 5. GCS motor subscore is 6.     Cranial Nerves: Cranial nerves 2-12 are intact.     Sensory: Sensation is intact.     Motor: Motor function is intact. No weakness or abnormal muscle tone.     Coordination: Coordination is intact.  Psychiatric:        Mood and Affect: Mood normal.        Speech: Speech normal.        Behavior: Behavior normal.     ED Results / Procedures / Treatments   Labs (all labs ordered are listed, but only abnormal results are displayed) Labs Reviewed - No data to display  EKG None  Radiology CT HEAD WO CONTRAST ( )  Result Date: 10/15/2023 CLINICAL DATA:  Neck trauma with midline tenderness. EXAM: CT HEAD WITHOUT CONTRAST CT CERVICAL SPINE WITHOUT CONTRAST TECHNIQUE: Multidetector CT imaging of the head and cervical spine was performed following the standard protocol without intravenous contrast. Multiplanar CT image reconstructions of the cervical spine were also generated. RADIATION DOSE REDUCTION: This exam was performed according to the departmental dose-optimization program which includes automated exposure control, adjustment of the mA and/or kV according to patient size and/or use of iterative reconstruction technique. COMPARISON:  02/02/2021 FINDINGS: CT HEAD FINDINGS Brain: No evidence of acute infarction, hemorrhage, hydrocephalus, extra-axial collection or mass lesion/mass effect. Subtle areas of white matter low-density scattered in the brain, also seen in 2022. These could reflect dilated perivascular spaces or chronic white matter disease, demyelinating process would be favored in a patient this young age. Vascular: No hyperdense vessel or unexpected calcification. Skull: Normal. Negative for fracture or focal lesion. Sinuses/Orbits: No evidence of injury CT CERVICAL SPINE FINDINGS Alignment: No traumatic malalignment Skull base and vertebrae: ACDF at  C5-T1 without new or complicating features. Chronic wedging at the T2 and T3 vertebral bodies attributed to remote superior endplate fracture. Fractures seen on prior have healed. Well demonstrated solid arthrodesis at C5-C7. Bony bridging is less certain at C7-T1, although no hardware failure. Soft tissues and spinal canal: No prevertebral fluid or swelling. No visible canal hematoma. Disc levels:  No bony impingement Upper chest: No evidence of injury IMPRESSION: 1. No evidence of acute intracranial or cervical spine injury. 2. Prior cervical spine fractures treated with ACDF from C5-C7. Remote T2 and T3 superior endplate fractures. 3. Subtle scattered low-density in the cerebral white matter also  seen in 2022. These could be dilated perivascular spaces or chronic white matter disease, question history of demyelination. Electronically Signed   By: Tiburcio Pea M.D.   On: 10/15/2023 06:35   CT CERVICAL SPINE WO CONTRAST  Result Date: 10/15/2023 CLINICAL DATA:  Neck trauma with midline tenderness. EXAM: CT HEAD WITHOUT CONTRAST CT CERVICAL SPINE WITHOUT CONTRAST TECHNIQUE: Multidetector CT imaging of the head and cervical spine was performed following the standard protocol without intravenous contrast. Multiplanar CT image reconstructions of the cervical spine were also generated. RADIATION DOSE REDUCTION: This exam was performed according to the departmental dose-optimization program which includes automated exposure control, adjustment of the mA and/or kV according to patient size and/or use of iterative reconstruction technique. COMPARISON:  02/02/2021 FINDINGS: CT HEAD FINDINGS Brain: No evidence of acute infarction, hemorrhage, hydrocephalus, extra-axial collection or mass lesion/mass effect. Subtle areas of white matter low-density scattered in the brain, also seen in 2022. These could reflect dilated perivascular spaces or chronic white matter disease, demyelinating process would be favored in a  patient this young age. Vascular: No hyperdense vessel or unexpected calcification. Skull: Normal. Negative for fracture or focal lesion. Sinuses/Orbits: No evidence of injury CT CERVICAL SPINE FINDINGS Alignment: No traumatic malalignment Skull base and vertebrae: ACDF at C5-T1 without new or complicating features. Chronic wedging at the T2 and T3 vertebral bodies attributed to remote superior endplate fracture. Fractures seen on prior have healed. Well demonstrated solid arthrodesis at C5-C7. Bony bridging is less certain at C7-T1, although no hardware failure. Soft tissues and spinal canal: No prevertebral fluid or swelling. No visible canal hematoma. Disc levels:  No bony impingement Upper chest: No evidence of injury IMPRESSION: 1. No evidence of acute intracranial or cervical spine injury. 2. Prior cervical spine fractures treated with ACDF from C5-C7. Remote T2 and T3 superior endplate fractures. 3. Subtle scattered low-density in the cerebral white matter also seen in 2022. These could be dilated perivascular spaces or chronic white matter disease, question history of demyelination. Electronically Signed   By: Tiburcio Pea M.D.   On: 10/15/2023 06:35   DG Lumbar Spine Complete  Result Date: 10/15/2023 CLINICAL DATA:  Fall with midline tenderness. EXAM: LUMBAR SPINE - COMPLETE 4 VIEW COMPARISON:  Lumbar MRI 02/03/2021 FINDINGS: There is no evidence of lumbar spine fracture. Alignment is normal. Intervertebral disc spaces are maintained. IMPRESSION: Negative. Electronically Signed   By: Tiburcio Pea M.D.   On: 10/15/2023 06:27   DG Thoracic Spine 2 View  Result Date: 10/15/2023 CLINICAL DATA:  Fall. EXAM: THORACIC SPINE 2 VIEWS COMPARISON:  None Available. FINDINGS: ACDF as assessed on cervical spine CT. There is no evidence of thoracic spine fracture. Alignment is normal. No other significant bone abnormalities are identified. IMPRESSION: Negative. Electronically Signed   By: Tiburcio Pea  M.D.   On: 10/15/2023 06:27    Procedures Procedures    Medications Ordered in ED Medications  ibuprofen (ADVIL) tablet 800 mg (has no administration in time range)  methocarbamol (ROBAXIN) tablet 500 mg (has no administration in time range)    ED Course/ Medical Decision Making/ A&P                                 Medical Decision Making Amount and/or Complexity of Data Reviewed Radiology: ordered.  Risk Prescription drug management.   Presents to the emergency department by ambulance after a fall.  Patient lost his balance while walking up  an incline and fell backwards.  He is complaining of neck pain and upper back pain.  He reports prior neck surgery.  Patient with normal sensation and strength in all 4 extremities.  Examination does not reveal any other injury.  Diffuse midline tenderness in neck and upper thoracic region.  X-ray thoracic and lumbar spine negative.  CT head, cervical spine without acute injury.        Final Clinical Impression(s) / ED Diagnoses Final diagnoses:  Cervical strain, acute, initial encounter    Rx / DC Orders ED Discharge Orders     None         Delia Sitar, Canary Brim, MD 10/15/23 [id number]

## 2023-12-01 ENCOUNTER — Encounter (HOSPITAL_COMMUNITY): Payer: Self-pay

## 2023-12-01 ENCOUNTER — Ambulatory Visit (INDEPENDENT_AMBULATORY_CARE_PROVIDER_SITE_OTHER): Payer: Medicaid Other

## 2023-12-01 ENCOUNTER — Ambulatory Visit (HOSPITAL_COMMUNITY)
Admission: EM | Admit: 2023-12-01 | Discharge: 2023-12-01 | Disposition: A | Discharge status: Home / Self Care | Payer: Medicaid Other | Attending: Nurse Practitioner | Admitting: Nurse Practitioner

## 2023-12-01 DIAGNOSIS — R0781 Pleurodynia: Secondary | ICD-10-CM

## 2023-12-01 MED ORDER — KETOROLAC TROMETHAMINE 30 MG/ML IJ SOLN
30.0000 mg | Freq: Once | INTRAMUSCULAR | Status: AC
Start: 2023-12-01 — End: 2023-12-01
  Administered 2023-12-01: 30 mg via INTRAMUSCULAR

## 2023-12-01 MED ORDER — KETOROLAC TROMETHAMINE 30 MG/ML IJ SOLN
INTRAMUSCULAR | Status: AC
  Filled 2023-12-01: qty 1

## 2023-12-01 NOTE — Discharge Instructions (Signed)
I will contact you later today if the x-ray shows abnormalities.  We have given you an injection of Toradol today which is an anti-inflammatory.  Do not take any other NSAIDs (Aleve, ibuprofen, Advil, naproxen, aspirin) for 48 hours.  You can take Tylenol 500 to 1000 mg every 6 hours for pain and apply ice to the area to help with pain.  Seek care if symptoms improve with treatment.
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
## 2024-08-04 ENCOUNTER — Emergency Department (HOSPITAL_COMMUNITY)
Admission: EM | Admit: 2024-08-04 | Discharge: 2024-08-04 | Disposition: A | Discharge status: Home / Self Care | Attending: Emergency Medicine | Admitting: Emergency Medicine

## 2024-08-04 DIAGNOSIS — T22212A Burn of second degree of left forearm, initial encounter: Secondary | ICD-10-CM | POA: Diagnosis not present

## 2024-08-04 DIAGNOSIS — T23072A Burn of unspecified degree of left wrist, initial encounter: Secondary | ICD-10-CM | POA: Diagnosis present

## 2024-08-04 DIAGNOSIS — X102XXA Contact with fats and cooking oils, initial encounter: Secondary | ICD-10-CM | POA: Insufficient documentation

## 2024-08-04 MED ORDER — HYDROCODONE-ACETAMINOPHEN 5-325 MG PO TABS: 1.0000 | ORAL_TABLET | ORAL | 0 refills | Status: AC | PRN

## 2024-08-04 MED ORDER — OXYCODONE-ACETAMINOPHEN 5-325 MG PO TABS
2.0000 | ORAL_TABLET | Freq: Once | ORAL | Status: AC
  Administered 2024-08-04: 2 via ORAL
  Filled 2024-08-04: qty 2

## 2024-08-04 MED ORDER — BACITRACIN ZINC 500 UNIT/GM EX OINT
TOPICAL_OINTMENT | Freq: Two times a day (BID) | CUTANEOUS | Status: DC
  Administered 2024-08-04: 1 via TOPICAL
  Filled 2024-08-04: qty 1.8
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
## 2024-08-04 NOTE — Discharge Instructions (Addendum)
 Return if any problems. Follow up at Urgent care for recheck in 2 days

## 2024-08-19 ENCOUNTER — Telehealth: Payer: Self-pay

## 2024-08-19 NOTE — Telephone Encounter (Signed)
 Called the pt back and no answer.  Copied from CRM #[id number]. Topic: Appointments - Scheduling Inquiry for Clinic >> Aug 09, 2024 10:25 AM David Mays wrote: Patient stated that he is trying establish care as well as schedule a ER followup appt.
